# Patient Record
Sex: Female | Born: 1965 | Race: Black or African American | Hispanic: No | Marital: Married | Smoking: Former smoker
Health system: Southern US, Community
[De-identification: ages and names within clinical notes are randomized; demographics above are authoritative.]

## PROBLEM LIST (undated history)

## (undated) HISTORY — PX: CHOLECYSTECTOMY: SHX55

## (undated) HISTORY — PX: ABDOMINAL HYSTERECTOMY: SHX81

---

## 1998-09-28 ENCOUNTER — Other Ambulatory Visit: Admission: RE | Admit: 1998-09-28 | Discharge: 1998-09-28 | Payer: Self-pay | Admitting: *Deleted

## 2000-04-05 ENCOUNTER — Other Ambulatory Visit: Admission: RE | Admit: 2000-04-05 | Discharge: 2000-04-05 | Payer: Self-pay | Admitting: Obstetrics and Gynecology

## 2000-09-28 ENCOUNTER — Emergency Department (HOSPITAL_COMMUNITY): Admission: EM | Admit: 2000-09-28 | Discharge: 2000-09-28 | Payer: Self-pay | Admitting: Emergency Medicine

## 2000-09-28 ENCOUNTER — Encounter: Payer: Self-pay | Admitting: Emergency Medicine

## 2000-09-30 ENCOUNTER — Inpatient Hospital Stay (HOSPITAL_COMMUNITY): Admission: AD | Admit: 2000-09-30 | Discharge: 2000-09-30 | Payer: Self-pay | Admitting: Obstetrics & Gynecology

## 2001-04-15 ENCOUNTER — Other Ambulatory Visit: Admission: RE | Admit: 2001-04-15 | Discharge: 2001-04-15 | Payer: Self-pay | Admitting: Obstetrics and Gynecology

## 2002-08-03 ENCOUNTER — Other Ambulatory Visit: Admission: RE | Admit: 2002-08-03 | Discharge: 2002-08-03 | Payer: Self-pay | Admitting: Obstetrics and Gynecology

## 2003-02-16 ENCOUNTER — Encounter: Payer: Self-pay | Admitting: Obstetrics and Gynecology

## 2003-02-16 ENCOUNTER — Inpatient Hospital Stay (HOSPITAL_COMMUNITY): Admission: AD | Admit: 2003-02-16 | Discharge: 2003-02-19 | Payer: Self-pay | Admitting: Obstetrics and Gynecology

## 2003-03-22 ENCOUNTER — Other Ambulatory Visit: Admission: RE | Admit: 2003-03-22 | Discharge: 2003-03-22 | Payer: Self-pay | Admitting: Obstetrics and Gynecology

## 2004-05-24 ENCOUNTER — Inpatient Hospital Stay (HOSPITAL_COMMUNITY): Admission: AD | Admit: 2004-05-24 | Discharge: 2004-05-27 | Payer: Self-pay | Admitting: Internal Medicine

## 2004-05-24 ENCOUNTER — Encounter: Admission: RE | Admit: 2004-05-24 | Discharge: 2004-05-24 | Payer: Self-pay | Admitting: Internal Medicine

## 2004-05-26 ENCOUNTER — Encounter (INDEPENDENT_AMBULATORY_CARE_PROVIDER_SITE_OTHER): Payer: Self-pay | Admitting: *Deleted

## 2005-06-04 IMAGING — CR DG CHEST 2V
2 series · 2 of 2 positions shown · non-contrast
Comparison: none

CLINICAL DATA: Gallstones, pancreatitis, pre-operative respiratory exam.
 TWO VIEW CHEST ? 05/24/04
 The heart size and mediastinal contours are normal. The lungs are clear. The visualized skeleton is unremarkable.

 IMPRESSION
 No active disease.

[view not recorded (1 of 2)]
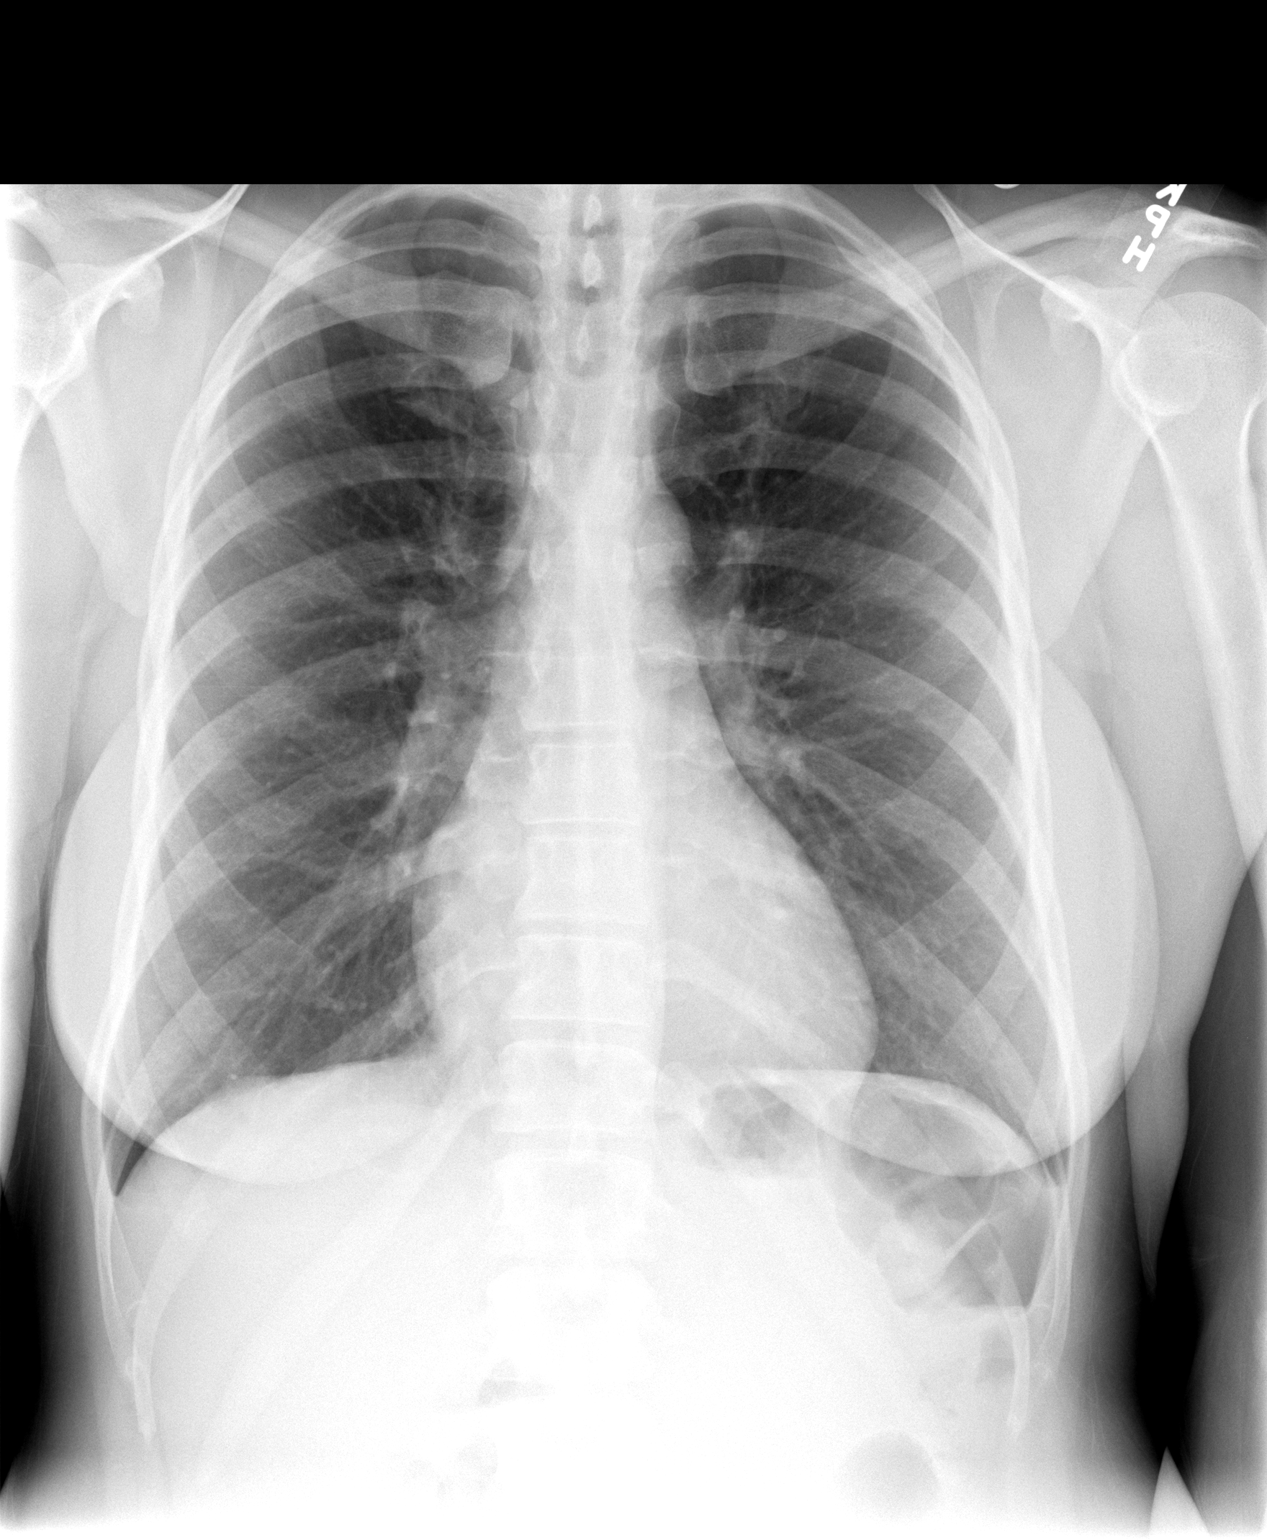

[view not recorded (2 of 2)]
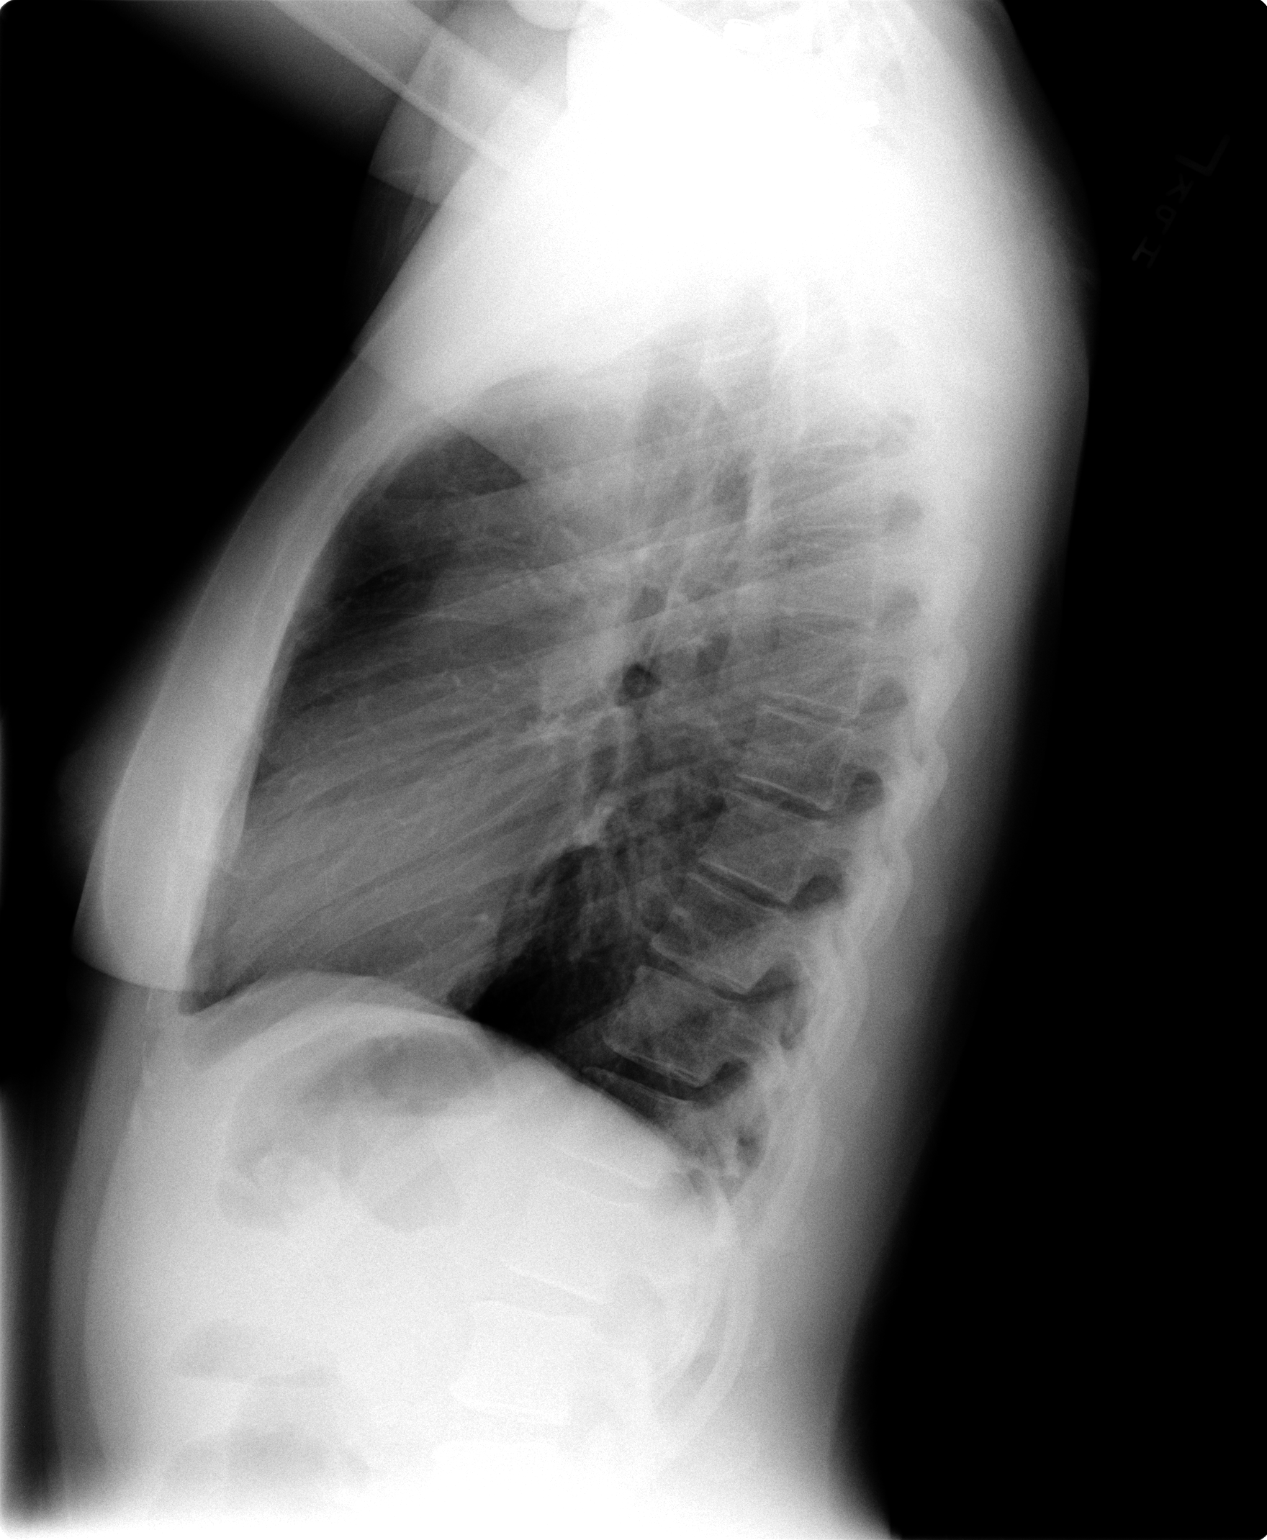

[2 of 2 positions shown; findings below may reference images not displayed]

## 2005-11-19 ENCOUNTER — Inpatient Hospital Stay (HOSPITAL_COMMUNITY): Admission: AD | Admit: 2005-11-19 | Discharge: 2005-11-28 | Payer: Self-pay | Admitting: Obstetrics and Gynecology

## 2005-12-05 ENCOUNTER — Inpatient Hospital Stay (HOSPITAL_COMMUNITY): Admission: AD | Admit: 2005-12-05 | Discharge: 2005-12-05 | Payer: Self-pay | Admitting: Obstetrics and Gynecology

## 2005-12-11 ENCOUNTER — Inpatient Hospital Stay (HOSPITAL_COMMUNITY): Admission: RE | Admit: 2005-12-11 | Discharge: 2005-12-14 | Payer: Self-pay | Admitting: Obstetrics and Gynecology

## 2005-12-11 ENCOUNTER — Encounter (INDEPENDENT_AMBULATORY_CARE_PROVIDER_SITE_OTHER): Payer: Self-pay | Admitting: *Deleted

## 2006-11-30 IMAGING — US US OB LIMITED
1 series · 13 of 28 positions shown · non-contrast
Comparison: none

CLINICAL DATA: 35 weeks estimated gestational age with fetal tachycardia.  Assess amniotic fluid volume and fetal well-being.

[Series 1: us ob limited · 0.41mm/px · 47 acquisitions, 13 frames shown]
[im 2/47]
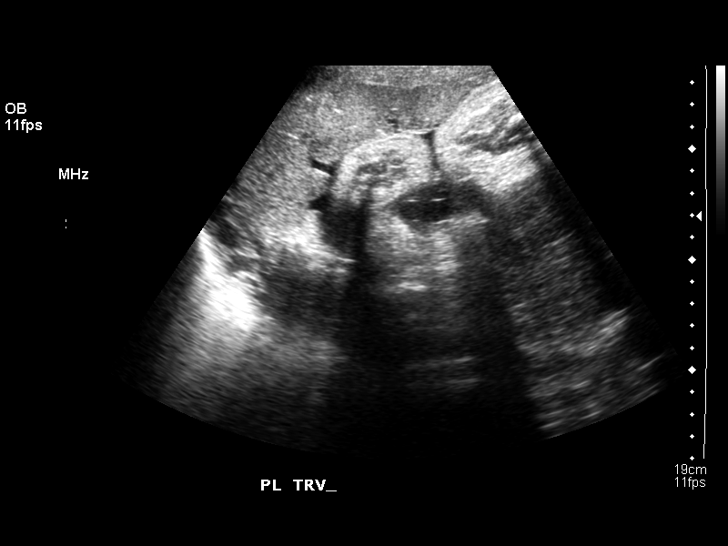
[im 6/47]
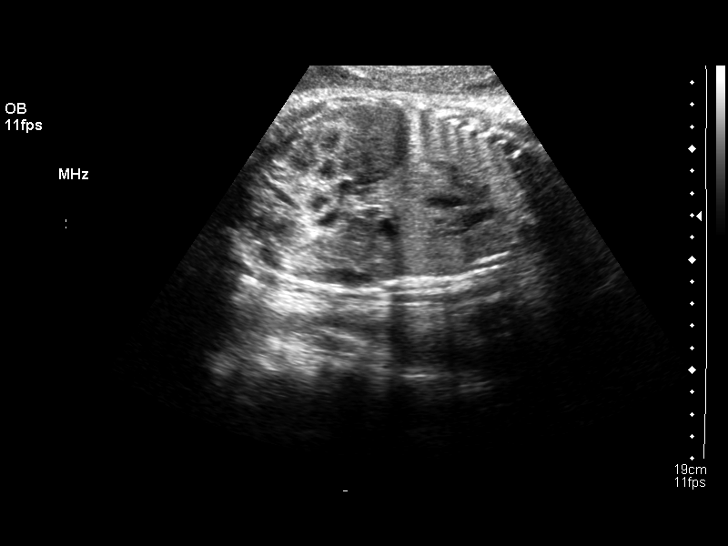
[im 9/47]
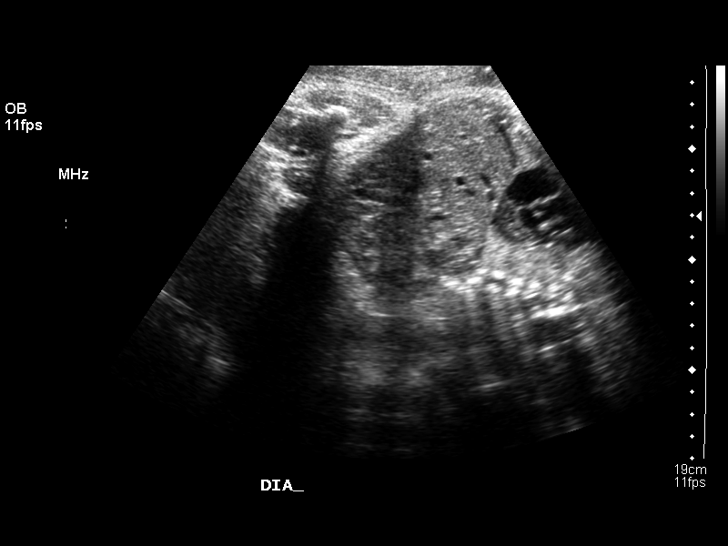
[im 12/47]
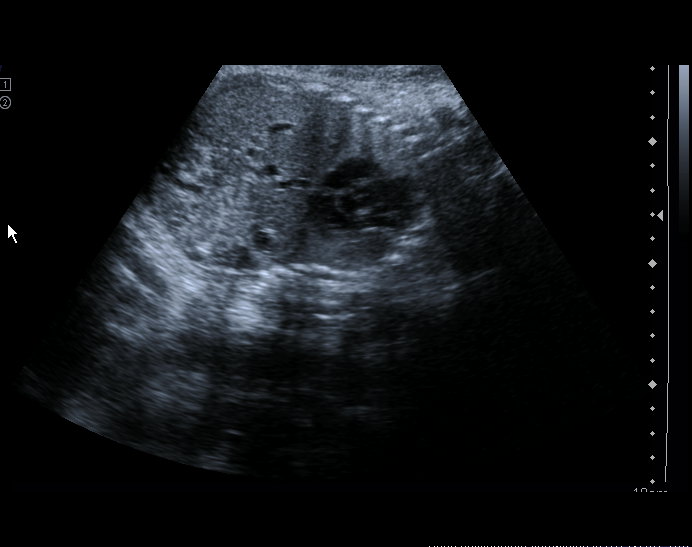
[im 16/47]
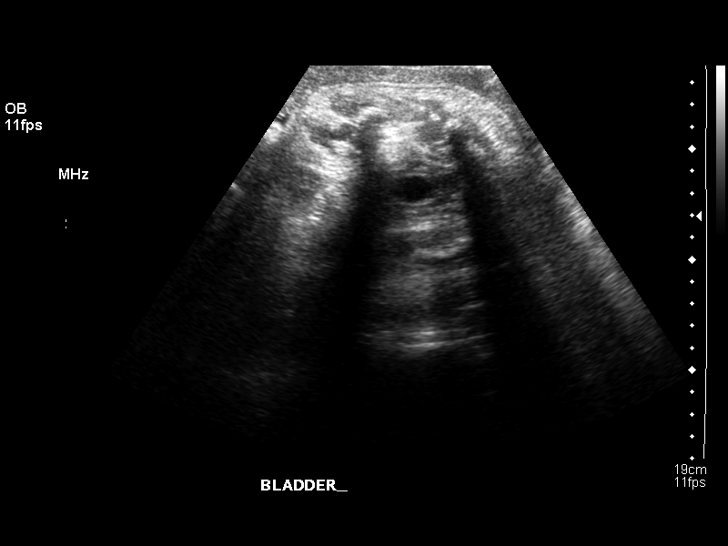
[im 19/47]
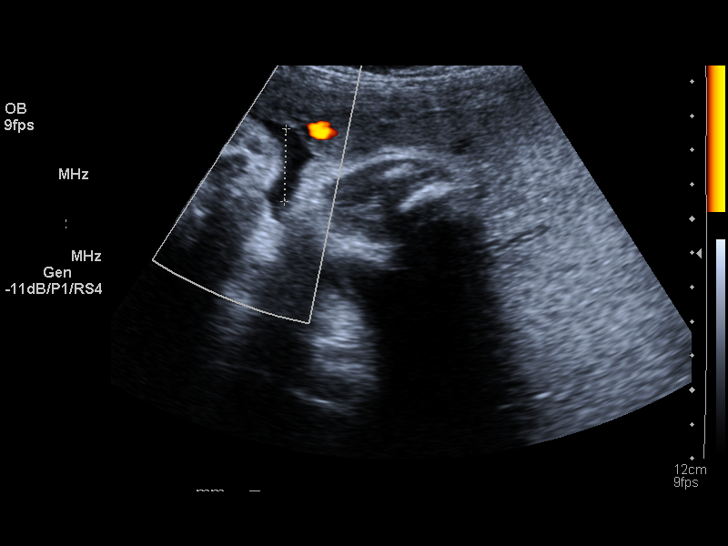
[im 24/47]
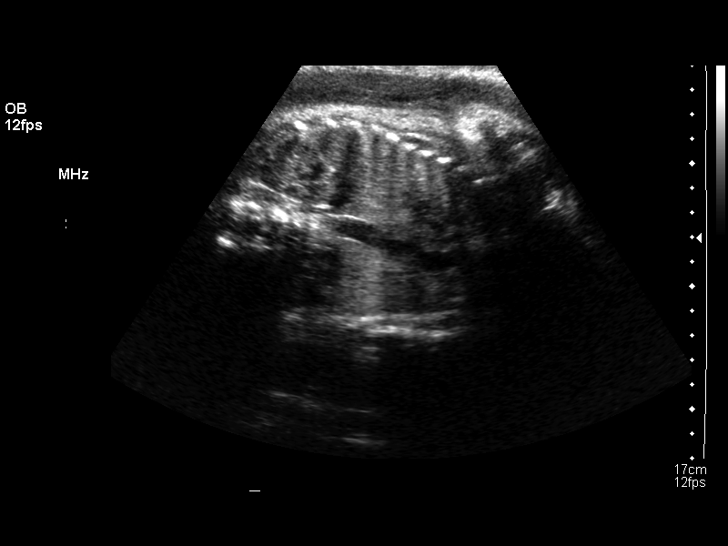
[im 28/47]
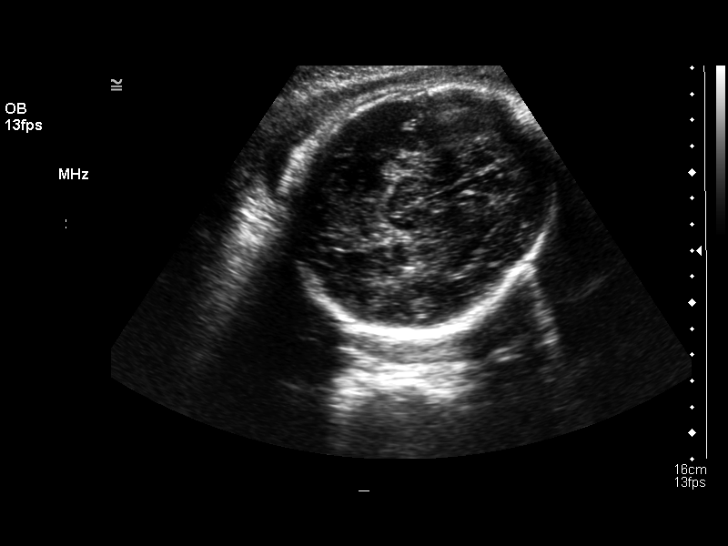
[im 31/47]
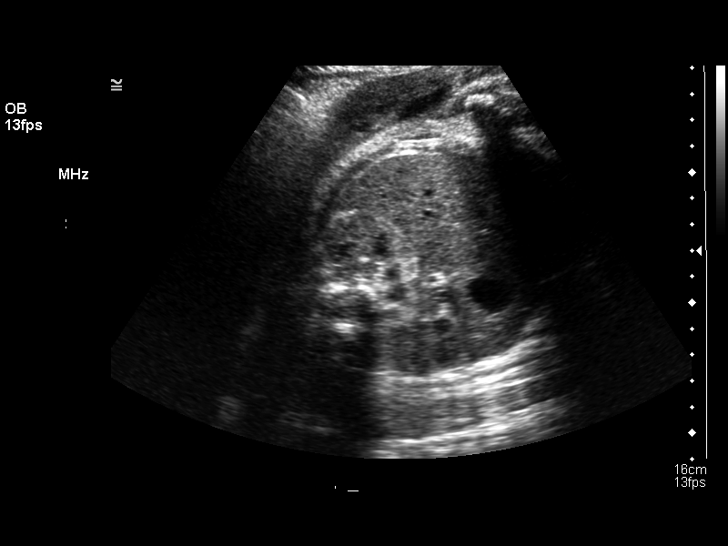
[im 35/47]
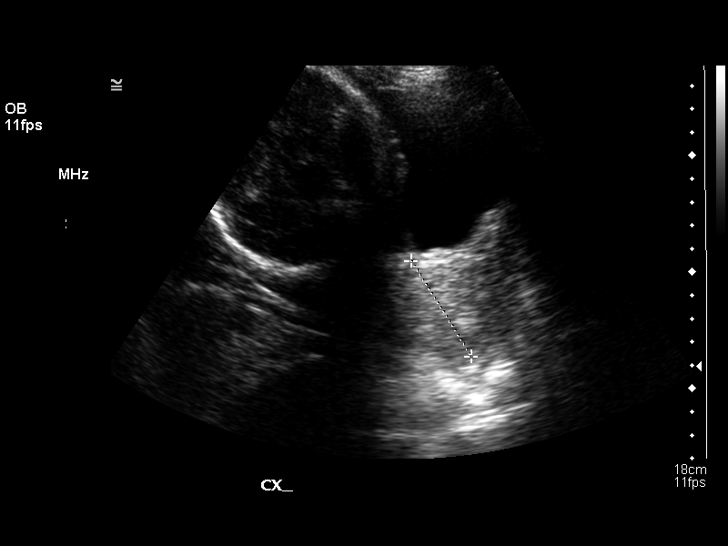
[im 38/47]
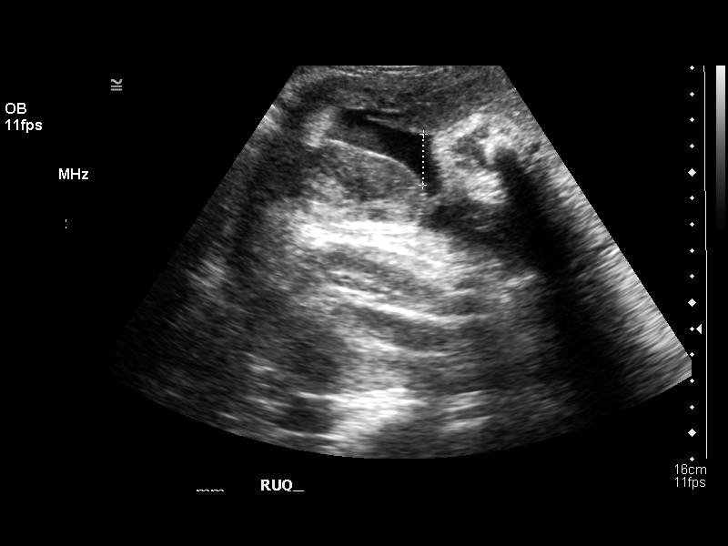
[im 41/47]
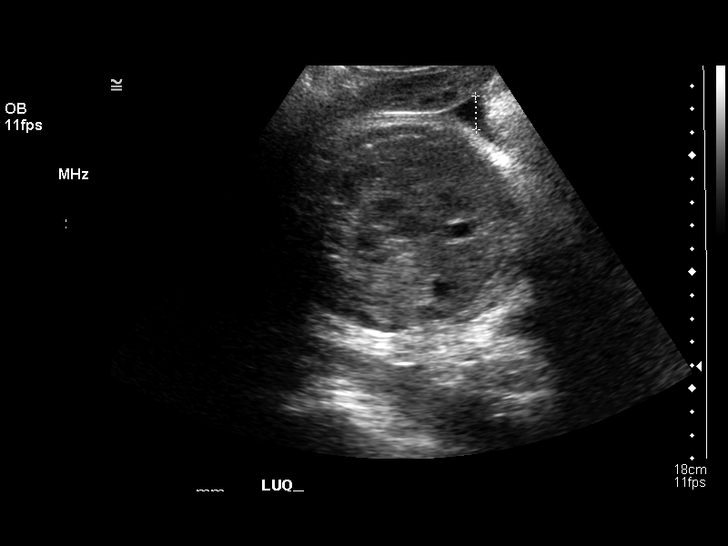
[im 45/47]
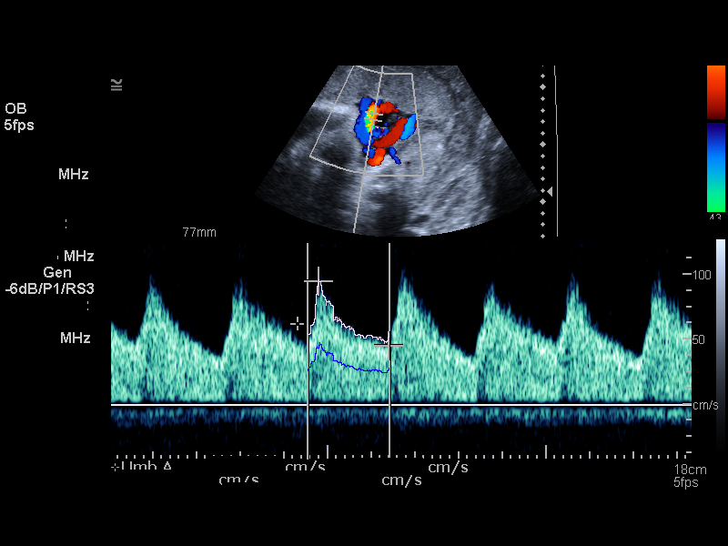

[13 of 28 positions shown; findings below may reference images not displayed]

LIMITED OBSTETRICAL ULTRASOUND:
Number of Fetuses:  1
Heart Rate:  163
Movement:  Yes
Breathing:  Yes
Presentation:  Cephalic
Placental Location:  Anterior, right lateral 
Grade:  II
Previa:  No
Amniotic Fluid (Subjective):  Decreased
Amniotic Fluid (Objective):  7.3 cm AFI (5th -95th%ile = 7.7 ? 24.9 cm for 36 wks)

Fetal measurements and complete anatomic evaluation were not requested.  The following fetal anatomy was visualized during this exam:  Thalami, stomach, kidneys, bladder, and diaphragm.

MATERNAL UTERINE AND ADNEXAL FINDINGS
Cervix:  4.3 cm Transabdominally
BIOPHYSICAL PROFILE

Movement:  2      Time:  20 minutes
Breathing:  2
Tone:  2
Amniotic Fluid:  2

Total Score:  8

DOPPLER ULTRASOUND OF FETUS:

Umbilical Artery S/D Ratio: 2.41    (NL< 3.33)
IMPRESSION: 1.  Subjectively and quantitatively slightly decreased amniotic fluid volume with an amniotic fluid index measured at 7.3 cm (7.7 to 24.9 cm for a 36 week gestation).  Because of the slightly decreased amniotic fluid volume, umbilical artery Doppler evaluation was performed.
2.  Biophysical profile score [DATE].
3.  Normal cervical length.  
4.  No late developing fetal anatomic abnormalities are identified associated with the lateral ventricles, stomach, kidneys, or bladder.  A four chamber heart view could not be reassessed due to positioning on today?s exam.
5.  Normal umbilical artery systolic-to-diastolic ratio.

## 2006-12-01 IMAGING — US US FETAL BPP W/O NONSTRESS
1 series · 14 of 25 positions shown · non-contrast
Comparison: Film done earlier the same day.

CLINICAL DATA: 39-year-old female with 36 week gestation with variable fetal heart rate and tachycardia. 

 BIOPHYSICAL PROFILE

[Series 1: us fetal bpp w/o nonstress · 0.33mm/px · 25 acquisitions, 14 frames shown]
[im 1/25]
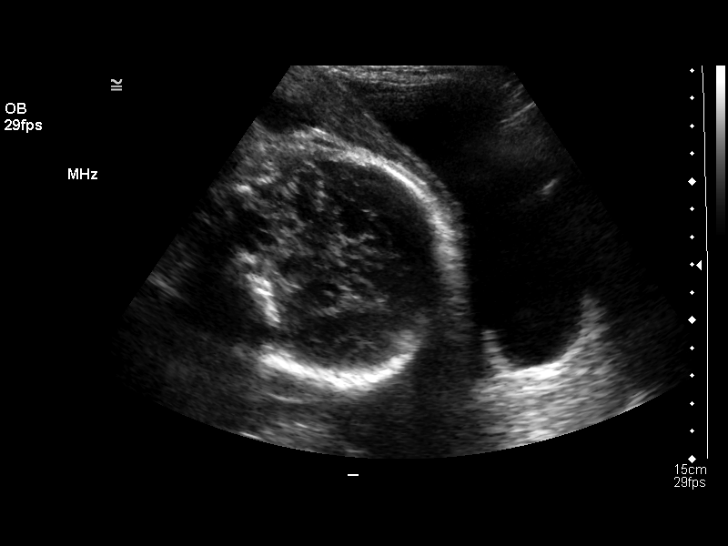
[im 3/25]
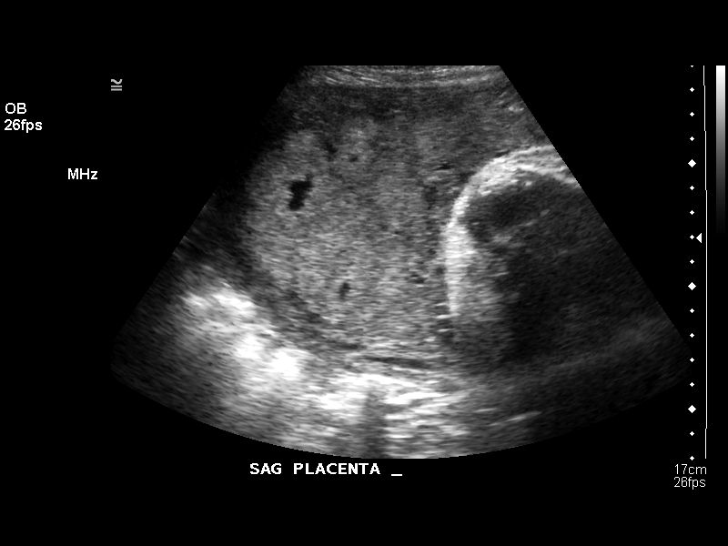
[im 5/25]
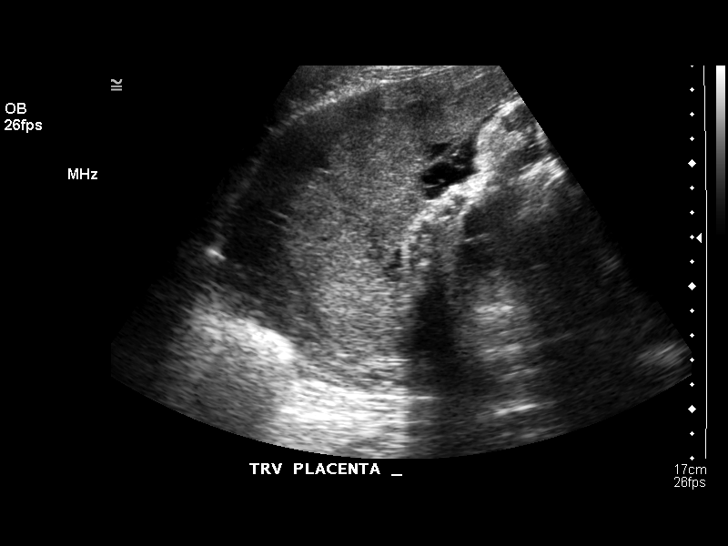
[im 7/25]
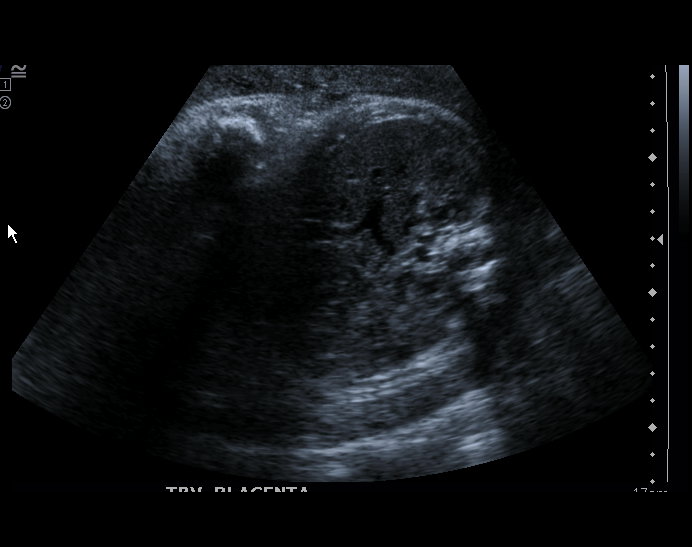
[im 9/25]
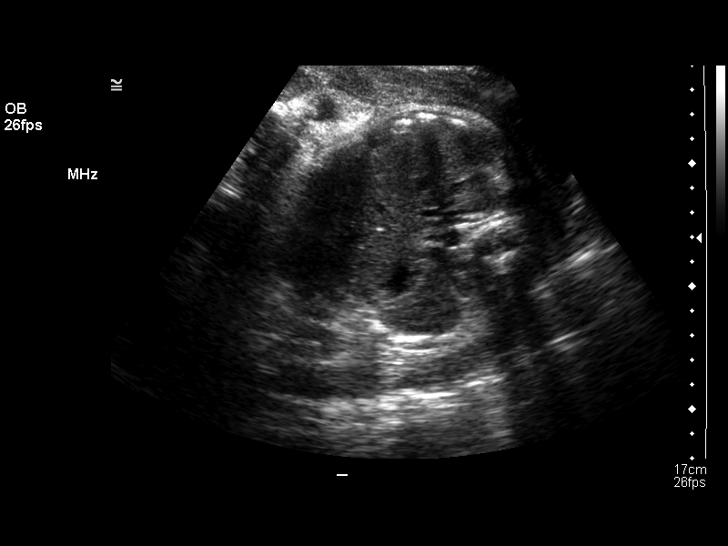
[im 10/25]
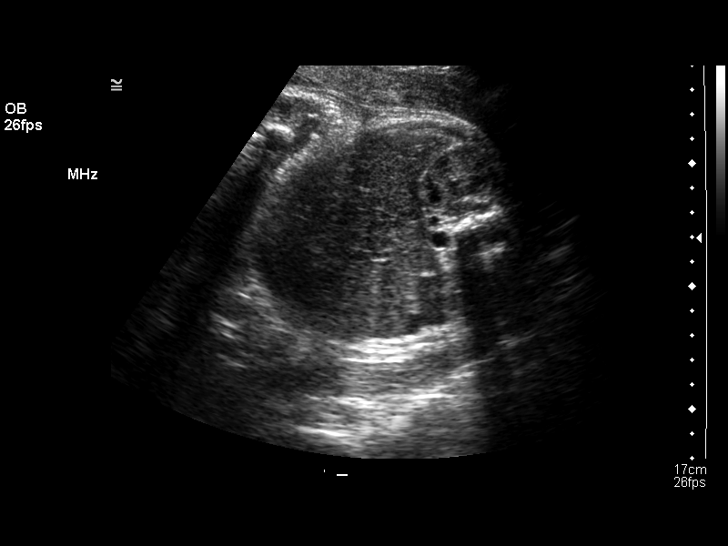
[im 12/25]
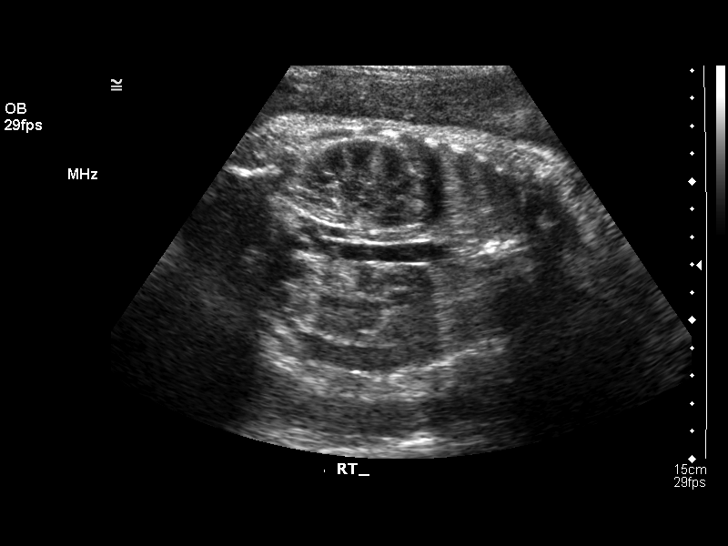
[im 14/25]
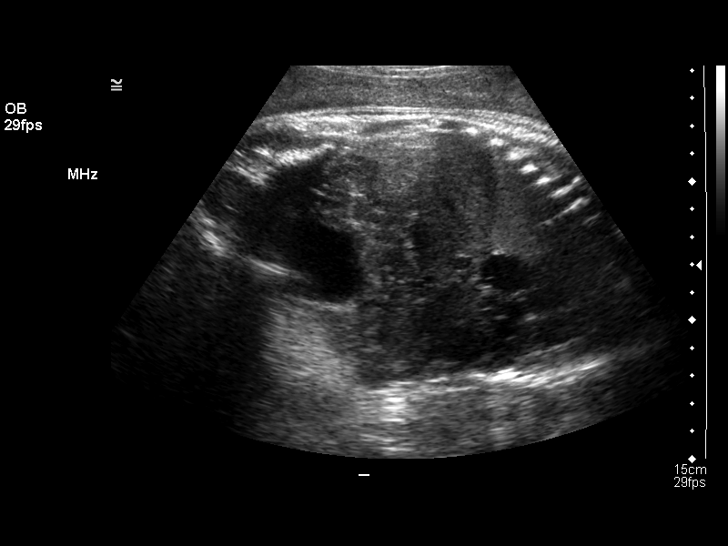
[im 16/25]
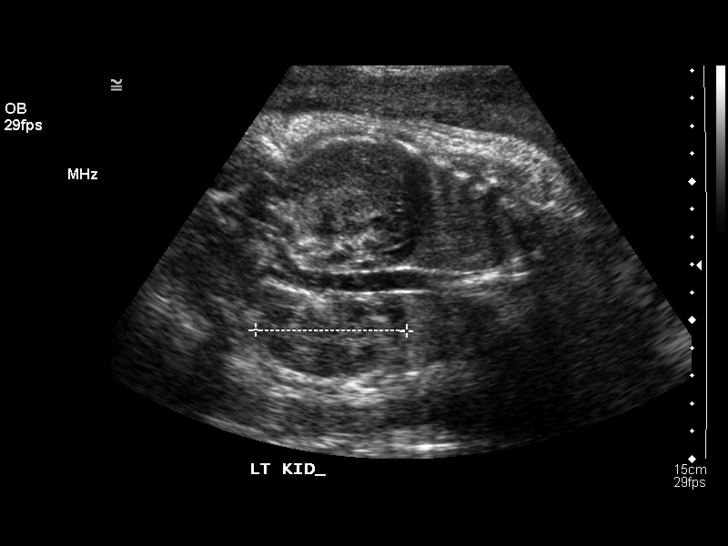
[im 17/25]
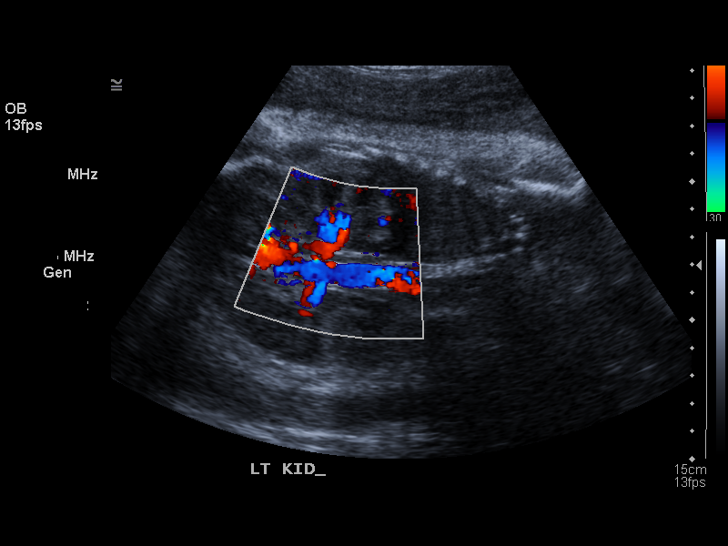
[im 19/25]
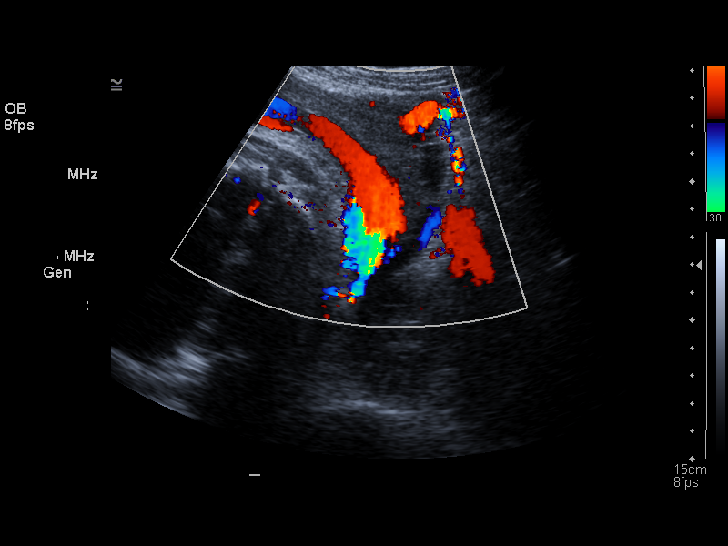
[im 21/25]
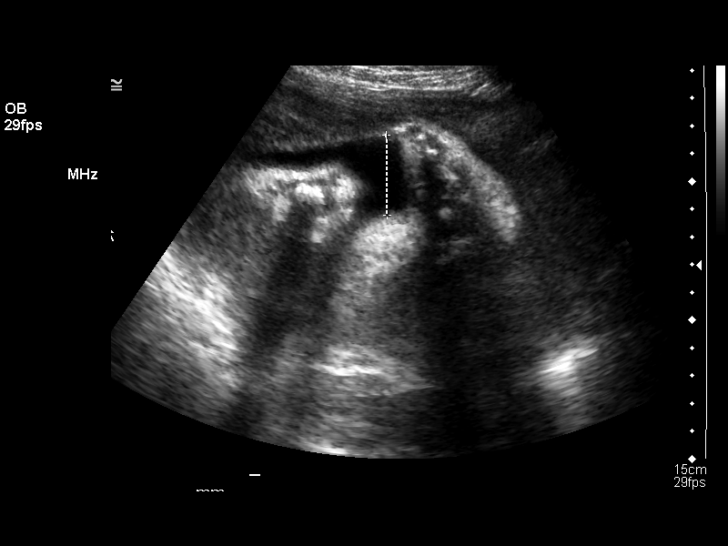
[im 23/25]
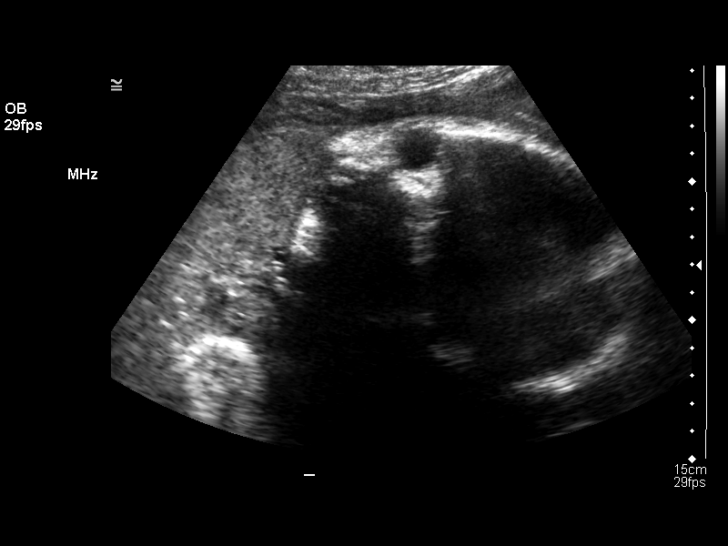
[im 25/25]
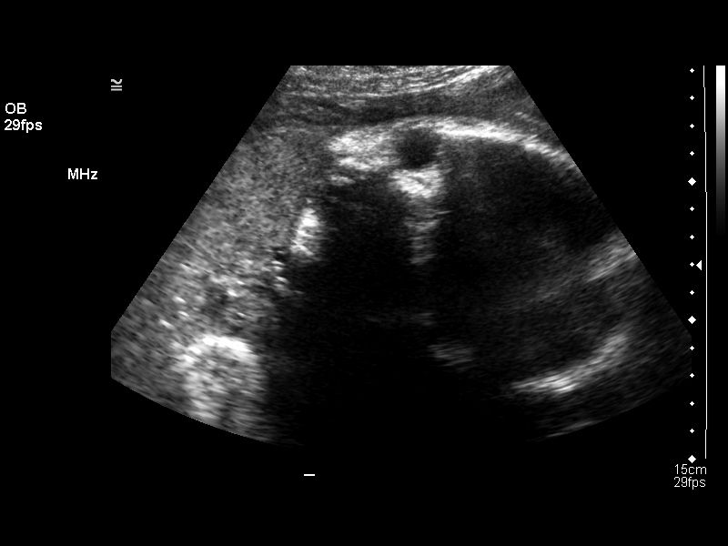

[14 of 25 positions shown; findings below may reference images not displayed]

Number of Fetuses:  1
 Heart rate:  163
 Movement:  Yes
 Breathing:  Yes
 Presentation:  Cephalic
 Placental Location:  Anterior, lateral 
 Grade:  II
 Previa:  No
 Amniotic Fluid (Subjective):  Decreased
 Amniotic Fluid (Objective):  5.7 cm AFI (5th -95th%ile = 7.7 ? 24.9 cm for 36 wks)

 Fetal measurements and complete anatomic evaluation were not requested.  The following fetal anatomy was visualized on this exam: Stomach, kidneys and bladder.

 BPP SCORING
 Movements:  2  Time:  10 minutes
 Breathing:  2
 Tone:  2
 Amniotic Fluid:  2
 Total Score:   8

 MATERNAL UTERINE AND ADNEXAL FINDINGS
 Cervix:  Not evaluated.
IMPRESSION: 1.  Single living intrauterine gestation in cephalic presentation with assigned gestational age of 36 weeks 0 days.  
 2.  Biophysical profile score [DATE] at 10 minutes.  
 3.  Oligohydramnios.

## 2006-12-03 IMAGING — US US OB LIMITED
1 series · 13 of 22 positions shown · non-contrast
Comparison: none

CLINICAL DATA: 35 weeks estimated gestational age with fetal tachycardia.  Assess amniotic fluid volume.

[Series 1: us ob limited · 0.39mm/px · 13 of 22 slices shown]
[im 1/22]
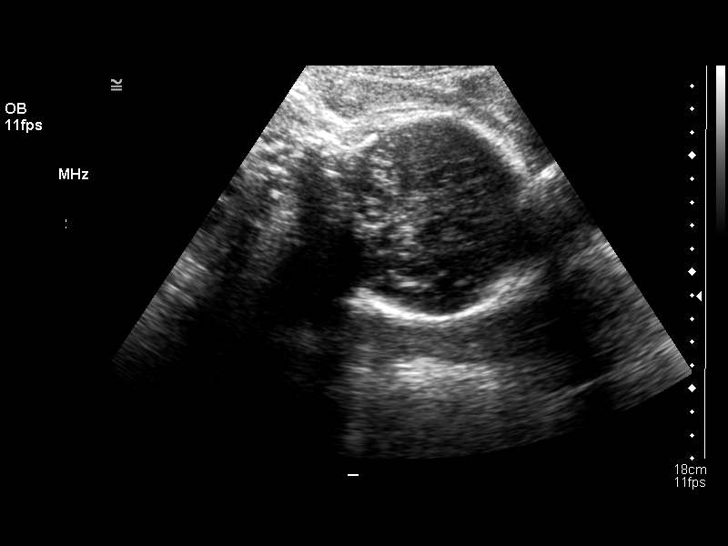
[im 3/22]
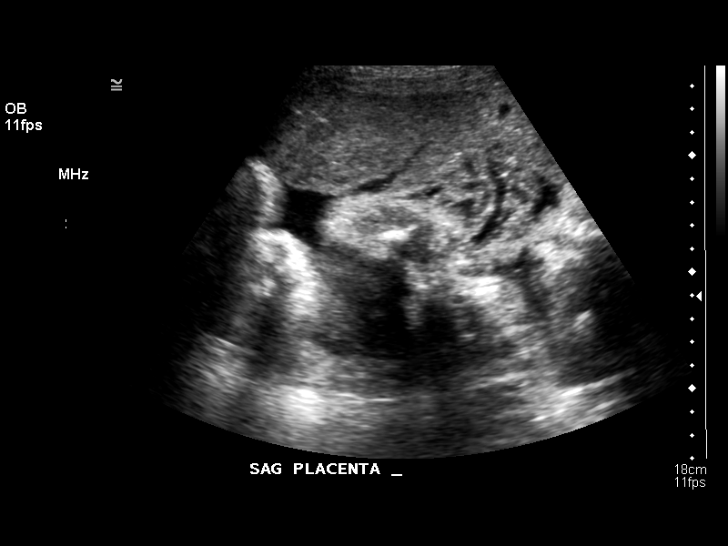
[im 5/22]
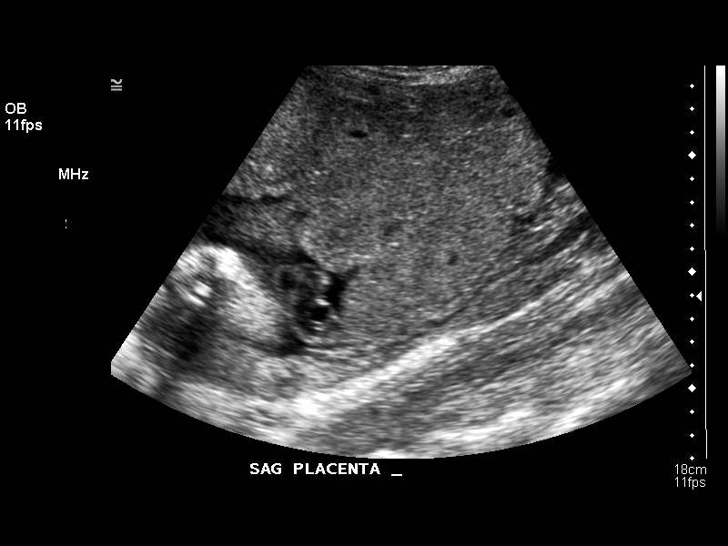
[im 6/22]
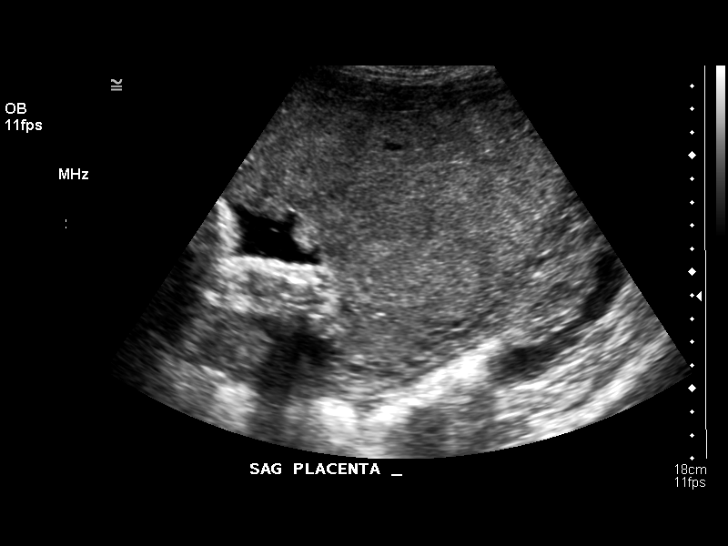
[im 8/22]
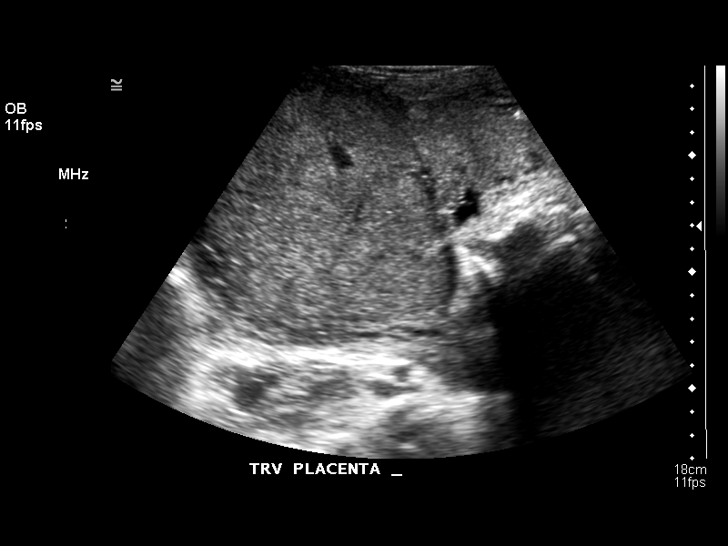
[im 10/22]
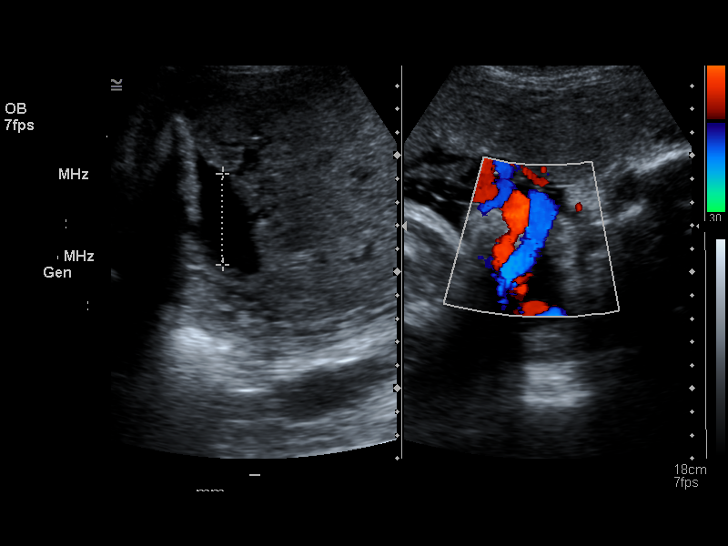
[im 12/22]
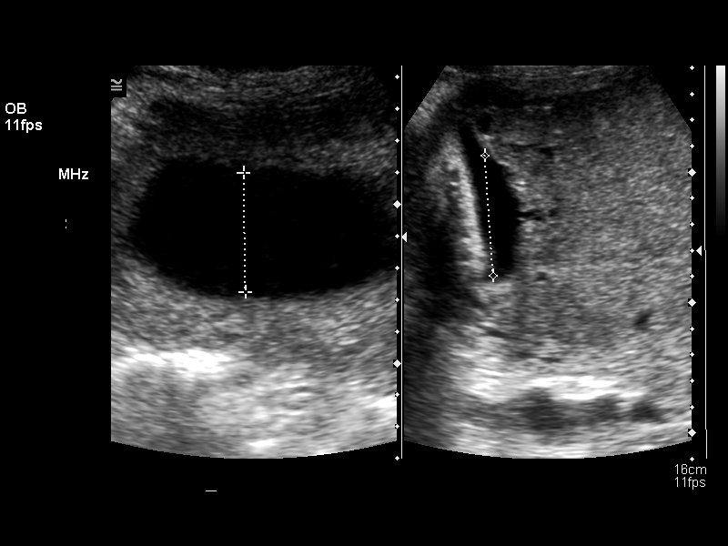
[im 13/22]
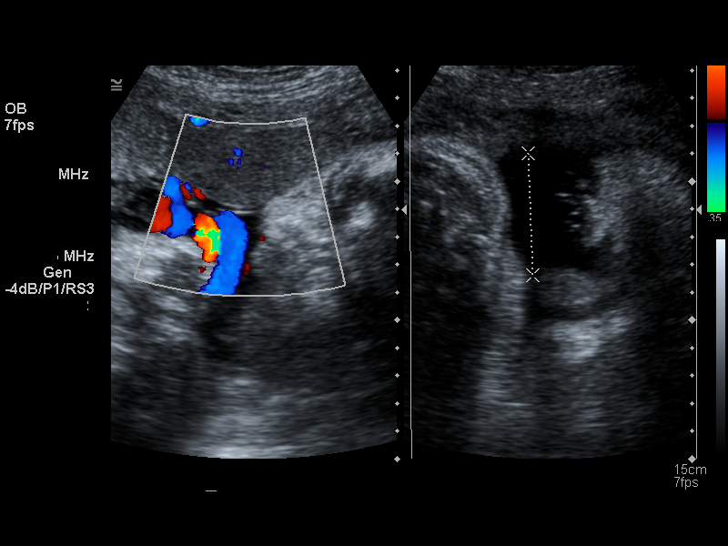
[im 15/22]
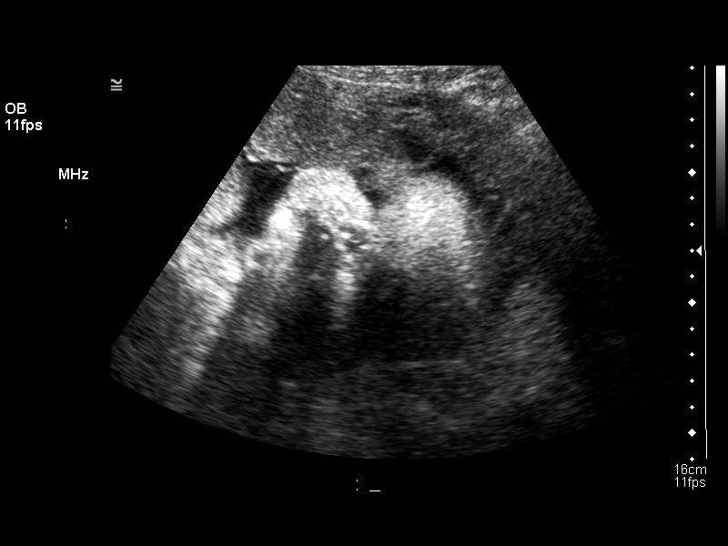
[im 17/22]
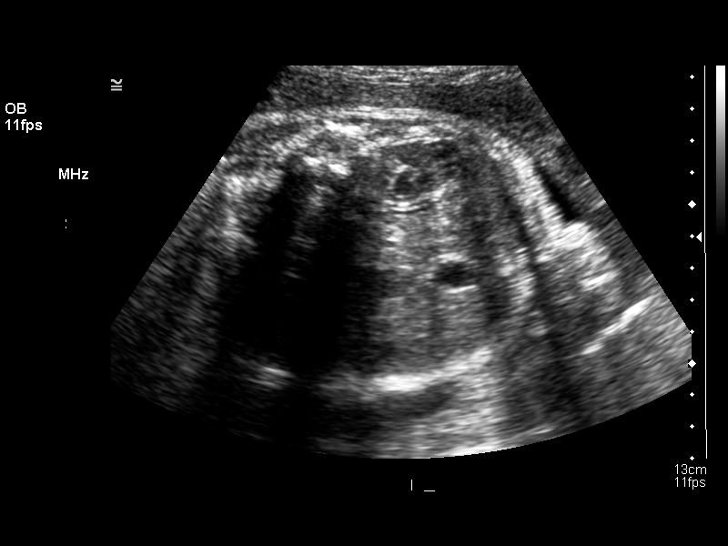
[im 18/22]
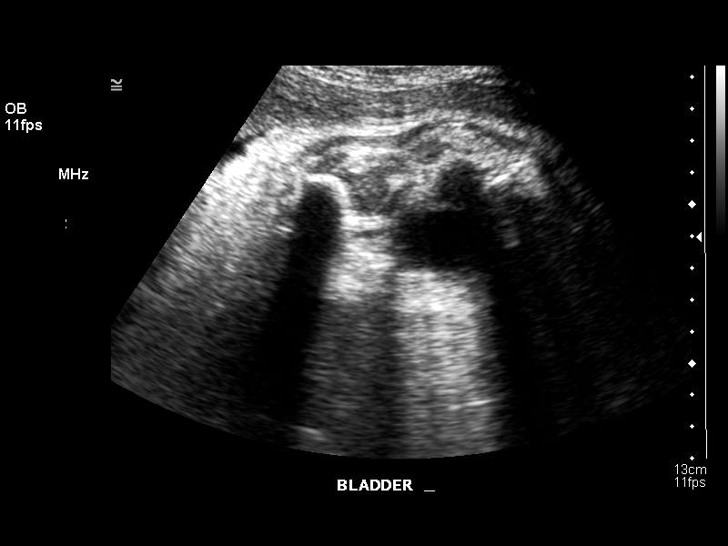
[im 20/22]
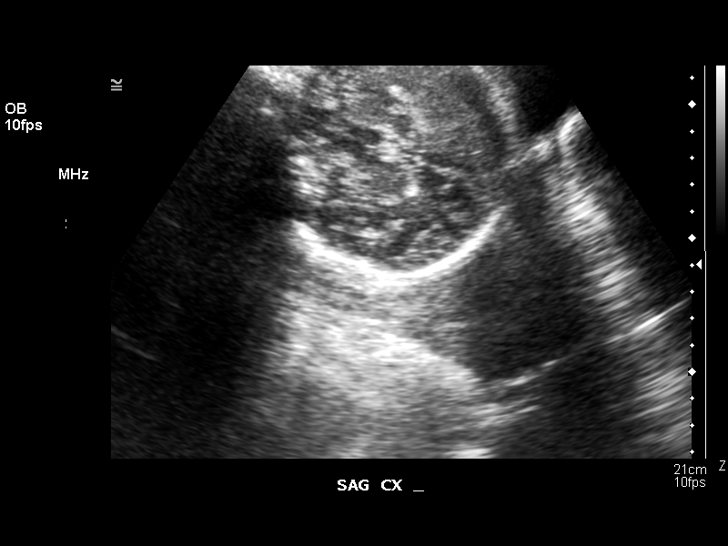
[im 22/22]
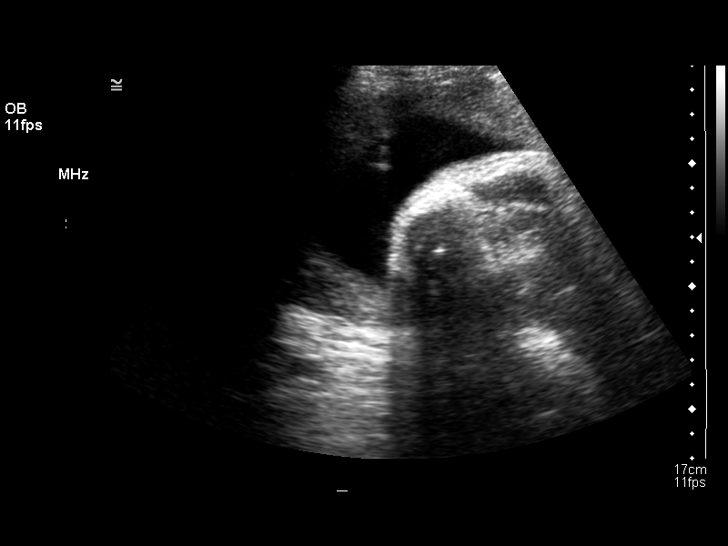

[13 of 22 positions shown; findings below may reference images not displayed]

LIMITED OBSTETRICAL ULTRASOUND:
Number of Fetuses:  1
Heart Rate:  122
Movement:  Yes
Breathing:  No
Presentation:  Cephalic
Placental Location:  Anterior, right lateral 
Grade: II
Previa:  No
Amniotic Fluid (Subjective):  Normal
Amniotic Fluid (Objective):  11.4 cm AFI (5th -95th%ile = 7.7 ? 24.9 cm for 36 wks)

Fetal measurements and complete anatomic evaluation were not requested.  The following fetal anatomy was visualized during this exam:  Lateral ventricles, stomach, kidneys, and bladder.

MATERNAL UTERINE AND ADNEXAL FINDINGS
Cervix:  4.4 cm Transabdominally
IMPRESSION: 1.  Subjectively and quantitatively normal amniotic fluid volume with an amniotic fluid index today measured at 11.4 cm.  This represents an interval improvement since the previous exam performed on 11/20/05 at which time the amniotic fluid volume was felt subjectively to be decreased with an amniotic fluid index measured at 5.7 cm.  
2.  Normal cervical length.
3.  No late developing fetal anatomic abnormalities are seen associated with the lateral ventricles, stomach, kidneys or bladder.  A four chamber heart view could not be reassessed due to positioning on today?s exam.

## 2011-04-09 ENCOUNTER — Other Ambulatory Visit: Payer: Self-pay

## 2011-04-09 ENCOUNTER — Other Ambulatory Visit (HOSPITAL_COMMUNITY)
Admission: RE | Admit: 2011-04-09 | Discharge: 2011-04-09 | Disposition: A | Discharge status: Home / Self Care | Payer: BC Managed Care – PPO | Source: Ambulatory Visit | Attending: Internal Medicine | Admitting: Internal Medicine

## 2011-04-09 DIAGNOSIS — Z01419 Encounter for gynecological examination (general) (routine) without abnormal findings: Secondary | ICD-10-CM | POA: Insufficient documentation

## 2011-05-04 NOTE — Discharge Summary (Signed)
   [name], [name]                         ACCOUNT NO.:  [id number]   MEDICAL RECORD NO.:  [id number]                   PATIENT TYPE:   LOCATION:                                       FACILITY:   PHYSICIAN:  Lenoard Aden, M.D.             DATE OF BIRTH:   DATE OF ADMISSION:  DATE OF DISCHARGE:                                 DISCHARGE SUMMARY   HISTORY OF PRESENT ILLNESS:  The patient underwent uncomplicated Cesarean  section on February 16, 2003.   DISPOSITION:  Discharged to home on February 19, 2003. Postoperative course  uncomplicated. Discharge teaching done. Iron, prenatal vitamins, Motrin and  Tylox given upon discharge. Discharge pamphlet given.   FOLLOW UP:  In the office in 4-6 weeks.                                               Lenoard Aden, M.D.    RJT/MEDQ  D:  04/03/2003  T:  04/03/2003  Job:  [id number]

## 2011-05-04 NOTE — Consult Note (Signed)
NAME:  [name], [name]                        ACCOUNT NO.:  [id number]   MEDICAL RECORD NO.:  [id number]                   PATIENT TYPE:  INP   LOCATION:  5735                                 FACILITY:  [facility]   PHYSICIAN:  Jimmye Norman III, M.D.               DATE OF BIRTH:  [birth year]   DATE OF CONSULTATION:  05/24/2004  DATE OF DISCHARGE:                                   CONSULTATION   Dear Dr. Delrae Alfred,   Thank you very much for asking me to see Ms. [name], a very pleasant  45 year old female with likely long history of symptoms related to  gallstones with recent acute gallstone pancreatitis.   HISTORY OF PRESENT ILLNESS:  She came in to see [place] today with abnormal liver  function tests yesterday and a worsening amylase and lipase today up in the  1000s.  Her pain has improved.  However, because of her ultrasound showing  multiple small gallstones, and the abnormal liver function tests with a  rising bilirubin, amylase, and lipase, she was admitted for gallstone  pancreatitis and consideration for ERCP, and laparoscopic cholecystectomy  with cholangiogram.   PAST MEDICAL HISTORY:  Very unremarkable.  She has no history of cardiac  disease, renal disease, pulmonary disease, or liver disease.   PAST SURGICAL HISTORY:  The only surgery is for a cesarean section 15 months  ago.   SOCIAL HISTORY:  She is a nonsmoker, has occasional wine, but does not drink  excessively.  She does continue to work.   REVIEW OF SYSTEMS:  She has chronic sort of indigestion, takes Tums on a  regular basis.  Is not taking Prevacid or Protonix or any other acid  suppressing medication.   ALLERGIES:  No known drug allergies.   PHYSICAL EXAMINATION:  VITAL SIGNS:  She is afebrile with a temperature of  98.1, her pulse is 81, blood pressure 116/79, her saturations are 97% on  room air.  HEENT:  Normocephalic, atraumatic, anicteric.  NECK:  Supple, no bruits, no palpable thyroid lesions.  LUNGS:  Clear to auscultation.  CARDIAC:  Regular rate and rhythm.  She does have a grade 1/6 murmur over  the cardiac silhouette and at the left lower sternal border.  There is no  thrill.  ABDOMEN:  Soft.  She is slightly distended with tenderness in the  epigastrium, mild tenderness in the right upper quadrant and right lower  quadrant.  No rebound or guarding.  Hypoactive bowel sounds.  PELVIC:  Not performed at this time.  RECTAL:  Not performed at this time.  BREASTS:  Not performed.  NEUROLOGIC:  Cranial nerves II-XII are grossly intact.   LABORATORY DATA:  She has a white count of 7, hemoglobin of 11.5, hematocrit  of 34.7, platelets of 236.  By report, her amylase and lipase were in the  1000s.  Amylase in the 4000 range.  Total bilirubin was above 1.5.  Ultrasound by report also demonstrates multiple small gallstones.   IMPRESSION:  Very clearly the patient has had an episode of significant  gallstone pancreatitis, and although the ultrasound does not definitively  show stones in the common bile duct, they could be there or migrate there  and repeat this episode of pancreatitis.  Currently, it seems to be a mild  form of pancreatitis, although her amylase and lipase are very elevated.   PLAN:  Go ahead and repeat her liver function tests along with her  pancreatic enzymes tomorrow morning, and if they show signs of improvement,  then go ahead and plan for a laparoscopic cholecystectomy with  intraoperative cholangiogram once the labs have normalized to a level of  safety.  This may be Friday morning or possibly Saturday morning, depending  upon how the patient is doing and how quickly she resolves her symptoms.  If  her liver function tests are worsening or about the same, I would elect to  do a preoperative endoscopic retrograde cholangiopancreatography with  possible sphincterotomy and stone removal.  Dr. Ewing Schlein has seen the patient  and we agree upon this plan based on his  written report.                                               Kathrin Ruddy, M.D.    JW/MEDQ  D:  05/24/2004  T:  05/25/2004  Job:  [id number]

## 2011-05-04 NOTE — Consult Note (Signed)
NAME:  [name], [name]                        ACCOUNT NO.:  [id number]   MEDICAL RECORD NO.:  [id number]                   PATIENT TYPE:  INP   LOCATION:  5735                                 FACILITY:  [facility]   PHYSICIAN:  Petra Kuba, M.D.                 DATE OF BIRTH:  Mar 12, 1966   DATE OF CONSULTATION:  05/24/2004  DATE OF DISCHARGE:                                   CONSULTATION   HISTORY OF PRESENT ILLNESS:  The patient is seen at the request of Dr.  Delrae Alfred for belly pain and almost certainly gallstone pancreatitis based on  labs and ultrasound which showed gallstones, but normal CBD and probably  pancreas.  Her symptoms have been getting worse since Wednesday when she ate  at the [employer].  She has really not had much GI symptoms previously.  She  does hurt in the midepigastric area, maybe some radiation to the back with  some nausea.  She has not had any fever.  She has been able to work and eat,  although, eating has made her worse.  In the past, she has been told she  might have some irritable bowel and some hemorrhoids and may have been put  on Tagamet 10 to 15 years ago, but no other obvious GI test was done.  She  did have [name] baby about 15 months ago, but was not aware that she had  gallstones.   PAST MEDICAL HISTORY:  Essentially negative.  She has not had any previous  surgeries.   ALLERGIES:  No known drug allergies.   CURRENT MEDICATIONS:  Protonix started two days ago and prenatal vitamins.   SOCIAL HISTORY:  Rarely drinks, does not smoke.  Minimizes over-the-counter  medicines.   FAMILY HISTORY:  Pertinent for grandmother and great aunt with gallstones,  but no other GI problems in the family.   REVIEW OF SYSTEMS:  Negative except as above.   PHYSICAL EXAMINATION:  VITAL SIGNS:  See chart, afebrile.  GENERAL:  The patient looks good, in no acute distress.  Examination pertinent for abdomen being very minimally tender in the upper  abdomen with no  guarding or rebound.  Soft.   LABORATORY DATA:  Pertinent for transaminases that were 1000 and that have  decreased.  But her bilirubin has gone from normal to 3 and likewise her  amylase and lipase were minimally elevated yesterday, but today have jumped  up to about 4000.   ASSESSMENT:  Gallstone pancreatitis.   PLAN:  Since the patient looks so good and is almost asymptomatic currently,  would wait on the [name].m. labs to decide ERCP first versus laparoscopic  cholecystectomy and intraoperative cholangiogram.  My guess is if she  continues to improve, her labs will decrease and she probably did pass the  stone.  I have discussed the risks, benefits, and methods of both ERCP,  sphincterotomy, stone extraction, possibly stent  placement versus  laparoscopic  cholecystectomy and intraoperative cholangiogram and compared those to open  cholangiogram with both the patient and her husband.  I will be on standby  to help p.r.n. otherwise recheck in the [name].m., but call me sooner p.r.n.  I  have discussed above with Dr. Delrae Alfred.                                               Petra Kuba, M.D.    MEM/MEDQ  D:  05/24/2004  T:  05/25/2004  Job:  [id number]   cc:   Marcene Duos, M.D.  [facility]  [place]  [place] [address]  Fax: [phone]   Jimmye Norman III, M.D.  [phone] [address]  [place]  [place] [address]  Fax: [phone]

## 2011-05-04 NOTE — Op Note (Signed)
[name], [name]              ACCOUNT NO.:  [id number]   MEDICAL RECORD NO.:  [id number]          PATIENT TYPE:  INP   LOCATION:  9105                          FACILITY:  [facility]   PHYSICIAN:  Maxie Better, M.D.DATE OF BIRTH:  [birth year]   DATE OF PROCEDURE:  12/11/2005  DATE OF DISCHARGE:                                 OPERATIVE REPORT   PREOPERATIVE DIAGNOSES:  1.  Previous cesarean section.  2.  Desires sterilization.  3.  Term gestation.  4.  Umbilical hernia.   PROCEDURE:  1.  Repeat cesarean section through a low transverse uterine incision.  2.  Left salpingectomy and right partial salpingectomy.  3.  Lysis of adhesions.  4.  Umbilical hernia repair by Dr. Lindie Spruce.   >/ SURGEON:  Maxie Better, M.D.   ASSISTANT:  Gerri Spore B. Earlene Plater, M.D.   ANESTHESIA:  Spinal.   PROCEDURE:  Under adequate spinal anesthesia, this patient was placed in the  supine position with a left lateral tilt.  She was sterilely prepped and  draped in the usual fashion to encompass above the umbilical area.  An  indwelling Foley catheter was already placed.  Eight mL of 0.25% Marcaine  were injected along the previous Pfannenstiel skin incision.  The  Pfannenstiel skin incision was then made and carried down to the rectus  fascia.  The rectus fascia was incised in the midline and extended  bilaterally.  The rectus fascia was then bluntly sharply dissected off the  rectus muscle in superior inferior fashion.  The rectus muscle was split in  midline.  The parietal peritoneum was entered and extended superiorly  inferiorly.  The bladder adhesions were then noted.  Careful dissection was  done and the vesicouterine peritoneum was then opened.  The bladder  partially was able to be dissected off of the lower uterine segment.  A  curvilinear low transverse uterine incision was then made and extended  bilaterally using bandage scissors.  Artificial rupture of membranes was  noted.  Subsequent  delivery of a live female with cord around the neck.  The  baby was bulb suctioned on the abdomen.  The cord was clamped and cut.  The  baby was transferred to the waiting pediatricians, who assigned Apgar's of 8  and 9 at 1 and 5 minutes.  Weight of the baby was 7 pounds 6 ounces.  The  cord bloods were obtained.  Cord blood for banking was also obtained.  The  placenta was manually removed.  The uterine cavity was then cleaned of  debris.  The uterine incision had no extension and was closed in one layer.  The closure was with 0 Monocryl running locked stitch with good hemostasis  noted.  Attention was then turned to the fallopian tubes.  The right  fallopian tube was identified down to its fimbriated end.  The right ovary  had some peritubal adhesions but otherwise normal.  The midportion of the  fallopian tube on the right was grasped with a Babcock.  The underlying  mesosalpinx was opened with cautery.  The proximal and distal portion of the  fallopian tube on the right was then tied with 0 chromic suture x2  proximally and distally and the intervening segment of tube was then  removed.  Attention was turned to the left, where the fimbriated end could  be identified close to the fundal area.  It was encased in adhesions, and in  trying to manipulate the tube and opening the adhesions with some lysis  being performed, increased bleeding was noted.  The tube was freed; however,  there was peritubal scarring and large blood vessels with ongoing bleeding.  The decision was then made to remove that tube.  The mesosalpinx was then  sequentially clamped and cut and the left tube was then removed.  Suture of  3-0 Vicryl was then used for hemostasis along the tube.  However, there was  bleeding noted on the posterior aspect of the uterus on that lateral side.  Several figure-of-8 sutures were placed.  Continued bleeding was noted.  The  decision was then made to exteriorize the uterus, which due  to its fibroid  nature was difficult to remove and, therefore, the skin incision was  extended bilaterally.  The uterus was then exteriorized.  The left ovary was  then noted to be posterior in the lower uterine segment and encased in  adhesions.  The bleeding along that lateral aspect was then hemostased with  multiple figure-of-8 sutures of 3-0 Vicryl.  When hemostasis was then  achieved, Dr. Lindie Spruce was brought in for the umbilical hernia repair.  On  inspection and palpation, the decision was made to do the hernia repair  externally and, therefore, the uterus was then returned to the abdomen.  The  abdomen was then copiously irrigated.  The distal and proximal end of the  tube on the right was inspected.  The tube was intact.  The left side was  intact.  The uterine incision had good hemostasis.  Small bleeders were  cauterized.  The parietal peritoneum and the vesicouterine peritoneum were  not closed.  The rectus fascia was closed with 0 Vicryl x2.  The  subcutaneous area was irrigated, small bleeders cauterized, and the skin  approximated using staples.  The umbilical repair was done by Dr. Lindie Spruce.  Please see his dictated note.  Specimen was placenta which was not sent to  pathology, and left fallopian tube and a portion of the right fallopian  tube, both of which were sent to Pathology.  Estimated blood loss was 600  mL.  Intraoperative fluid is 3 L.  Urine output was 200 mL of clear yellow  urine.  Sponge and instrument counts x2 were correct.  Complication was  none.  The patient tolerated the procedure well and was transferred to the  recovery room in stable condition.      Maxie Better, M.D.  Electronically Signed     [place]/MEDQ  D:  12/11/2005  T:  12/11/2005  Job:  [id number]

## 2011-05-04 NOTE — Op Note (Signed)
NAME:  [name], [name]                        ACCOUNT NO.:  [id number]   MEDICAL RECORD NO.:  [id number]                   PATIENT TYPE:  INP   LOCATION:  NA                                   FACILITY:  [facility]   PHYSICIAN:  Lenoard Aden, M.D.             DATE OF BIRTH:  1966-06-29   DATE OF PROCEDURE:  02/16/2003  DATE OF DISCHARGE:                                 OPERATIVE REPORT   PREOPERATIVE DIAGNOSES:  1. Intrauterine pregnancy at 38+ weeks, footling breech in active labor with     meconium.  2. Spontaneous rupture of membranes.   POSTOPERATIVE DIAGNOSES:  1. Intrauterine pregnancy at 38+ weeks, footling breech in active labor with     meconium.  2. Spontaneous rupture of membranes.  3. Pelvic adhesions and right fundal fibroid.   PROCEDURE:  1. Primary low transverse cesarean section.  2. Lysis of adhesions.   SURGEON:  Lenoard Aden, M.D.   ANESTHESIA:  Spinal.   ANESTHESIOLOGIST:  John [name]. Maisie Fus, M.D.   ESTIMATED BLOOD LOSS:  800 mL.   COMPLICATIONS:  None.   DRAINS:  Foley.   COUNTS:  Correct.   DISPOSITION:  Patient to recovery in good condition.   FINDINGS:  Full term living female, bilateral club foot anomaly noted.  Pediatricians in attendance.  Apgars 7 and 9.  Thick meconium.  DeLee  suctioning performed.  Bilateral thick ovarian adhesions to the posterior  wall of the uterus.  Normal appearing tubes with tubal adhesions to the back  wall of the uterus on the left side specifically.  Right fundal intramural  fibroid about 6 x 6 cm.  Posterior wall adhesions to the back of the uterus  of the sigmoid colon and posterior cul-de-sac obliterated by these  adhesions.   DESCRIPTION OF PROCEDURE:  After being apprised of the risks of anesthesia,  infection, bleeding, injury to abdominal organs and need for repair, the  patient was brought to the operating room where she was administered [name]  spinal anesthetic without complications, prepped and draped  in the usual  sterile fashion and Foley catheter previously placed.  Consents previously  signed.  After achieving adequate anesthesia, [name] dilute Marcaine solution was  placed in the area of Pfannenstiel skin incision, 10 mL of 0.5% solution.  The Pfannenstiel skin incision  was made, carried down to the fascia which was nicked in the midline and  opened transversely using Mayo scissors.  The rectus muscles were dissected  sharply in the midline, peritoneum entered sharply and bladder blade placed.  Visceral peritoneum was scored in [name] smiling fashion and lower uterine  segment incised with curved hysterotomy incision was made.  Atraumatic  delivery from [name] footling breech position using the usual maneuvers and  flexion of the fetal vertex of [name] full term living female, 6 pounds, 9 ounces,  Apgars 7 and 9, DeLee suctioning performed.  Minimal meconium noted in  the  suction tubing.  No meconium below the cords as noted by pediatricians.  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  Uterus exteriorized.  As noted, [name] right fundal fibroid which is intramural.  Placenta was  delivered manually and intact.  The uterine incision is closed in two layers  using [name] 0 Monocryl.  Bladder flap inspected and found to be hemostatic.  Previous reports of left ovarian cyst revealed no evidence of [name] left ovarian  or right ovarian cyst, however, both ovaries are small and thickly adhesed  to the posterior wall of the uterus.  The posterior cul-de-sac is  obliterated by adhesions to the bowel in this fashion.  Both tubes appeared  within normal limits.  There are adhesions of the left tube that are  distorting it to the left posterior wall of  the uterus.  These filmy  adhesions are lysed sharply using electrocautery and good hemostasis is  noted along the lysis of left tubal adhesions.  The uterine incision is  found to be hemostatic.  The uterus is replaced.  Irrigation reveals good  hemostasis.  Bladder  flap is inspected and found to be hemostatic.  The  fascia is then closed using [name] 0 Vicryl in [name] continuous running fashion.  Skin closed using staples.  The patient tolerated the procedure well and is  transferred to the recovery room in good condition.                                               Lenoard Aden, M.D.    RJT/MEDQ  D:  02/16/2003  T:  02/16/2003  Job:  [id number]

## 2011-05-04 NOTE — Discharge Summary (Signed)
NAMESHAIA, PORATH              ACCOUNT NO.:  [id number]   MEDICAL RECORD NO.:  [id number]          PATIENT TYPE:  INP   LOCATION:  9157                          [facility]ILITY:  [facility]   PHYSICIAN:  Maxie Better, M.D.DATE OF BIRTH:  09-16-1966   DATE OF ADMISSION:  11/19/2005  DATE OF DISCHARGE:  11/28/2005                                 DISCHARGE SUMMARY   ADMISSION DIAGNOSES:  1.  Pharyngitis.  2.  Fetal tachycardia.  3.  Intrauterine gestation at 36 weeks.   DISCHARGE DIAGNOSES:  1.  Pharyngitis, resolved.  2.  Oligohydramnios, resolved.  3.  Intrauterine gestation at 37 weeks.   HISTORY OF PRESENT ILLNESS:  A 45 year old gravida 4, para 1-0-2-1 female at  52 weeks, presented with sore throat and was found to have fetal  tachycardia.  The patient had axillary temperature of 100.2.  Fetal heart  rate on monitoring was 170, reactive with no decelerations or contractions.   HOSPITAL COURSE:  The patient was hospitalized for IV fluids.  She was  starting Augmentin.  Throat culture had been obtained in the office.  The  patient was placed on continuous fetal monitoring and given Tylenol.  Ultrasound was performed during her hospitalization for intermittent  variable decelerations.  Amniotic fluid level of 7.3 was noted consistent  with oligohydramnios.  The patient was continued to be monitored and  remained in the office for continued IV hydration.  The patient had episodic  decelerations with contractions, but otherwise reactive tracing.  The  patient remained in the hospital due to these findings.  She continued  intermittent decelerations and resulted in the decision or proceed with  amniocentesis.  On November 22, 2005, the patient underwent an uncomplicated  amniocentesis.  The result was subsequently consistent with immature fetal  lungs and the patient was, therefore, continued with as an inpatient.  Amniotic fluid subsequently had improved.  Decision was made to continue  to  monitor the patient.  The patient subsequently had resolution of her  decelerations and has on hospital day #9, she was doing well enough to be  discharged home.  She had a reactive non-stress test.   DISPOSITION:  Home.   CONDITION:  Stable.   DISCHARGE MEDICATIONS:  Prenatal vitamins one p.o. daily.   FOLLOW UP:  November 30, 2005.   DISCHARGE INSTRUCTIONS:  Call for decreased fetal movement, labor,  spontaneous rupture of membranes, or vaginal bleeding.      Maxie Better, M.D.  Electronically Signed     [place]/MEDQ  D:  01/30/2006  T:  01/30/2006  Job:  [id number]

## 2011-05-04 NOTE — Op Note (Signed)
NAME:  [name], [name]                        ACCOUNT NO.:  [id number]   MEDICAL RECORD NO.:  [id number]                   PATIENT TYPE:  INP   LOCATION:  5743                                 FACILITY:  [facility]   PHYSICIAN:  Jimmye Norman III, M.D.               DATE OF BIRTH:  January 25, 1966   DATE OF PROCEDURE:  05/26/2004  DATE OF DISCHARGE:                                 OPERATIVE REPORT   PREOPERATIVE DIAGNOSIS:  Gallstone pancreatitis.   POSTOPERATIVE DIAGNOSIS:  Gallstone pancreatitis, acute cholecystitis.   PROCEDURE:  Laparoscopic cholecystectomy with interoperative cholangiogram.   SURGEON:  Jimmye Norman, M.D.   ASSISTANT:  Sandria Bales. Ezzard Standing, M.D.   ANESTHESIA:  General endotracheal anesthesia.   ESTIMATED BLOOD LOSS:  50 mL.   COMPLICATIONS:  None.   CONDITION:  Good.   INDICATIONS FOR PROCEDURE:  The patient is a 45 year old female with  elevated liver function tests and amylase and lipase and now comes in for an  elective laparoscopic cholecystectomy.   FINDINGS:  The patient had acute inflammation with adhesions to the  gallbladder omentum.  She also had large stones in the dome of the  gallbladder and a relatively large cystic duct.  Cholangiogram was normal  and showed flow into the duodenum with a common portal for the common bile  duct and the pancreatic duct going into the duodenum.  There was no  obstruction and no stones in the duct.   PROCEDURE IN DETAIL:  The patient was taken to the operating room and placed  on the table in supine position.  After an adequate endotracheal anesthesia  was administered, she was prepped and draped in the usual sterile manner  exposing the midline and the right upper quadrant.  A supraumbilical  curvilinear incision was made using a #11 blade.  This was taken down to the  midline fascia through which a Veress needle was passed into the peritoneal  cavity under direct vision and confirmed to be in position using the saline  test.  Once this was done, carbon dioxide insufflation was instilled into  the peritoneal cavity up to a maximal intra-abdominal pressure of 15 mmHg.  When this was done, a 12 mm cannula was passed under direct vision using the  Optiview and secured in position.  Carbon dioxide insufflation was  continued.  We then passed two right costal margin 5 mm cannulae and a  subxiphoid 11 mm cannula under direct vision.  The patient was placed in  reversed Trendelenburg, the left side was tilted down, and the dissection  begun.   The dome of the gallbladder was densely adhesed to omental adhesions which  we took down using blunt dissection with the electrocautery.  This freed up  the infundibulum which we subsequently retracted towards the anterior  abdominal wall and the right upper quadrant.  Once this was done, we were  able to dissect out the peritoneum overlying  the hepatoduodenal triangle and  the triangle of Calot isolating a 7-8 mm cystic duct and the cystic artery,  both which were endoclipped.  We endoclipped the cystic duct along the  gallbladder side and then I made a a cholecystodochotomy using the  endoscopic scissors.  We were able to pass a Cook catheter into the  cholecystodochotomy and performed cholangiogram conforming flow into the  duodenum, no proximal or distal obstruction, normal size duct, and no common  duct stones.   Once this was completed, we removed the catheter and the clip that had held  it in place.  We then clipped the distal cystic duct using endoclips x 3 and  then transected the cystic duct.  We dissected off the gallbladder from its  bed with minimal difficulty.  Once that was completed, we removed the  cannulae after irrigating with 2.5 to 3 liters of saline solution.  We  closed the supraumbilical site using a figure-of-eight stitch of 0 Vicryl.  The skin was closed using a running subcuticular stitch of Vicryl.  All  needle counts, sponge counts, and  instrument counts were correct.  The  patient was then taken to the recovery room in stable condition with sterile  dressings applied.                                               Kathrin Ruddy, M.D.    JW/MEDQ  D:  05/26/2004  T:  05/26/2004  Job:  [id number]   cc:   Marcene Duos, M.D.  [address]  [place]  [place] [address]  Fax: [phone]   Petra Kuba, M.D.  [address], [address]  [place]  [place] [address]  Fax: [phone]
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
## 2011-05-04 NOTE — Op Note (Signed)
[name], [name]              ACCOUNT NO.:  [id number]   MEDICAL RECORD NO.:  [id number]          PATIENT TYPE:  INP   LOCATION:  9105                          [facility]   PHYSICIAN:  Cherylynn Ridges, M.D.    DATE OF BIRTH:  [birth year]   DATE OF PROCEDURE:  12/11/2005  DATE OF DISCHARGE:                                 OPERATIVE REPORT   PREOPERATIVE DIAGNOSIS:  Umbilical hernia.   POSTOPERATIVE DIAGNOSIS:  Umbilical hernia.   PROCEDURE:  Primary repair of umbilical hernia without mesh.   ASSISTANT:  Maxie Better, M.D.   ANESTHESIA:  Spinal.   ESTIMATED BLOOD LOSS:  Less than 10 mL.   COMPLICATIONS:  None.   CONDITION:  Stable.   OPERATION:  The patient had just finished a C-section under spinal.  The  uterus was out but had been placed back in prior to primary closure of the  umbilical hernia,which we repaired from the outside.   A supraumbilical curvilinear incision was made using a #15 blade and taken  down to the subcutaneous tissue.  I then used Metzenbaum scissors to dissect  out the hernia sac up to the fascial edges using Metzenbaum scissors.  We  had to resect two pieces of omentum prior to closure of the fascial defect,  which was done between hemostat clamps and 3-0 Vicryl ties.  Once this was  done, we closed the hernia repair of the umbilical hernia directly by  reapproximating the fascia using interrupted #1 Novofil sutures.  Three  interrupted sutures were placed and then we ran it from the right to the  left side using a running #1 Novofil and attaching it to one of the simple  stitches by tying it down on the left side.  We irrigated with saline,  cauterized for hemostasis, and closed the skin using a running subcuticular  stitch of 4-0 Monocryl.  All counts were correct.      Cherylynn Ridges, M.D.  Electronically Signed     JOW/MEDQ  D:  12/11/2005  T:  12/11/2005  Job:  [id number]   cc:   Maxie Better, M.D.  Fax: [phone]

## 2011-05-04 NOTE — H&P (Signed)
NAME:  [name], [name]                        ACCOUNT NO.:  [id number]   MEDICAL RECORD NO.:  [id number]                   PATIENT TYPE:  INP   LOCATION:  5735                                 FACILITY:  [facility]   PHYSICIAN:  Marcene Duos, M.D.         DATE OF BIRTH:  1966-05-13   DATE OF ADMISSION:  05/24/2004  DATE OF DISCHARGE:                                HISTORY & PHYSICAL   CHIEF COMPLAINT:  Abdominal pain.   HISTORY OF PRESENT ILLNESS:  [name] is a 45 year old female who  presented to my office yesterday with a complaint of epigastric and some  right upper quadrant pain beginning about a week ago, after eating a lot of  heavy food at the [facility] for her anniversary, also had a glass of wine  with that.  The following morning, she developed a fair amount of epigastric  pain.  She states she began popping Tums which seemed to help initially  but does not seem to be helping now.  She has had no definite reflux or  heartburn type symptoms that go up into her chest.  No radiation to her  back.  She can not say that the pain is any worse in any particular  position, but when she does eat, just about everything at this point, seems  to make it a bit worse.  Though she has continued to eat and drink.  She  states she had something similar to this about 12 years ago.  She stopped  eating fried and fatty foods and was put on Tagamet which resolved the  issue.  She does have a grandmother with a history of gallbladder disease  but no one else that she is aware of.  Bowel movements have been really  fairly normal.  She thought her urine looked maybe a bit dark this morning.  The patient also described a __________  of her right middle finger that she  opened up with a sterilized needle, and she feels that this is healing  though the skin is thickened where it was.  Laboratory obtained late  yesterday evening showed an amylase elevated at 166, lipase of 99, a glucose  of  111.  Electrolytes were normal.  Her total bilirubin was 1.8 with an alk  phos of 266.  AST 1,099, ALT 1, 054.  Because her exam was so benign  yesterday and she was continuing to eat and drink, we had her followup today  for repeat labs to see if she had possibly passed a gallstone.  She did have  an abdominal ultrasound today that did show multiple gallstones.  The common  bile duct was not dilated, however, and preliminary report did not show a  thickened gallbladder wall.  Today laboratory, however, just returned with  an amylase of 4,051, lipase is needing to be diluted because of its  elevation.  Her total bilirubin is now up to 3.2 with  a direct of 2.1, while  her transaminases are 576 and 948, AST and ALT respectively.  Alk phos is up  to 327 today.   PAST MEDICAL HISTORY:  1. The patient had delivery of an infant this past year.  2. Acne.   PAST SURGICAL HISTORY:  1. TAB at age 15.  2. Wisdom teeth extraction, 1997.   CURRENT MEDICATIONS:  1. Prenatal vitamins.  2. Tums.   ALLERGIES:  No known drug allergies.   FAMILY HISTORY:  Mother age 65 with history of coronary artery disease,  obesity.  Father age 1 healthy, but his family does have a history of  diabetes.  Sister age 52 with a history of encephalitis and seizures.  Sister age 2 that is healthy, and her son who is less than a year with a  history of bilateral club feet but otherwise healthy.   SOCIAL HISTORY:  The patient has been married since May 18, 2001.  She is a  hair stylist.  Tobacco use:  Quit December 2002.  Smoked about two  cigarettes a day previously.  Alcohol:  Occasional use.  Recreational drugs:  MJ in college.  Caffeine:  Occasional cup of coffee.  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   PHYSICAL EXAMINATION:  VITAL SIGNS:  Yesterday her blood pressure was  120/64, temp 98.3, pulse 80, weight 164 pounds.  HEENT:  Pupils equal, round and reactive to light.  Extraocular  movements  intact.  Tympanic membranes are clear.  Throat without injection.  NECK:  Supple without adenopathy.  CHEST:  Clear.  CARDIOVASCULAR:  Regular rate and rhythm without murmur or rub.  ABDOMEN:  Soft.  She had mild epigastric and minimal right upper quadrant  tenderness.  No definite Murphy sign.  No rebound or peritoneal signs.  Abdomen is soft.  Bowel sounds are present throughout.  No organomegaly or  masses appreciated.  EXTREMITIES:  She did also have a thickening on her right middle finger  lateral nailbed callus like skin.  No erythema or swelling there.  Otherwise  extremities without edema.   ASSESSMENT/PLAN:  The patient has developed probable gallstone pancreatitis  at this point, worsening gallstone pancreatitis at this point, though she  continues to have just similar symptoms to what she has had the past week.  I am going to admit her.  I have discussed her with Dr. Currie Paris.  Will consult [facility] for possible ERCP and then following  that a probable laparoscopic cholecystectomy.  We will also begin  intravenous antibiotics, intravenous rehydration, and make her nothing by  mouth with intravenous pain control as well.                                                Marcene Duos, M.D.    EMM/MEDQ  D:  05/24/2004  T:  05/25/2004  Job:  [id number]

## 2011-05-04 NOTE — Discharge Summary (Signed)
[name], [name]                        ACCOUNT NO.:  [id number]   MEDICAL RECORD NO.:  [id number]                   PATIENT TYPE:  INP   LOCATION:  5743                                 FACILITY:  [facility]   PHYSICIAN:  Marcene Duos, M.D.         DATE OF BIRTH:  05-May-1966   DATE OF ADMISSION:  05/24/2004  DATE OF DISCHARGE:  05/27/2004                                 DISCHARGE SUMMARY   ADMISSION DIAGNOSIS:  Gallstone pancreatitis.   DISCHARGE DIAGNOSIS:  Gallstone pancreatitis.   PROCEDURE:  Laparoscopic cholecystectomy, May 26, 2004.   CONSULTATIONS:  Jimmye Norman, M.D., May 24, 2004.  Petra Kuba, M.D., May 24, 2004.   HISTORY OF PRESENT ILLNESS AND HOSPITAL COURSE:  The patient is a 45-year-  old female who presented to my office with one week of epigastric pain,  after a heavy meal for her anniversary.  She had minimal right upper  quadrant pain, no radiation to the back, fever or chills, or change habits.  She had similar pain 12 years ago and improved with dietary changes and  Tagamet.  The patient was noted initially to have a minimal elevation of  amylase and lipase, lipase at 99.  AST ALT greater than 1,000, ALP 200+ and  a total bilirubin of 1.8.  Ultrasound, the following day - the day of  admission, showed multiple gallstones, a normal but contracted gallbladder,  normal pancreas, and that her common bile duct was not dilated.  Her white  count was not elevated as well.  On repeat laboratory the day of admission,  white count was 7.0, the hemoglobin 11.7.  AST 285, ALT 647, ALP 285.  Total  bilirubin 1.4.  Amylase was now up to 2,252 with a lipase of 2,074.  Urinalysis was normal.  The patient's exam, however, was quite benign with  mild epigastric and right upper quadrant tenderness.  No rebound or  peritoneal signs, and she was afebrile.  The following day - day of  admission, repeat labs:  White count was 7.0, the hemoglobin 11.7.  AST 285,  ALT  647, ALP 285.  Total bilirubin 1.4.  Amylase was now up to 2,252 with a  lipase of 2,074.  Urinalysis was normal.  The patient was admitted, made  NPO, and place on IV fluids.  GI and general surgery were consulted.  It was  decided to proceed with laparoscopic cholecystectomy when her pancreatic  enzymes dropped the following day, and because she appeared so stable.  Laparoscopic cholecystectomy was performed on May 26, 2004, Dr. Lindie Spruce.  The  patient tolerated this well and was in stable condition with improving  laboratory.  On May 27, 2004, amylase of 134, lipase of 89, total bilirubin  0.7, alk phos 198, and was taking p.o. with resolving pancreatitis and it  was felt that she could be discharged to home.  The patient was, thus,  discharged in stable condition.  I am unable to find her discharge medication list in her record at this  time.                                                Marcene Duos, M.D.    EMM/MEDQ  D:  08/05/2004  T:  08/06/2004  Job:  [id number]

## 2011-05-04 NOTE — Discharge Summary (Signed)
NAMERUBIE, FICCO              ACCOUNT NO.:  [id number]   MEDICAL RECORD NO.:  [id number]          PATIENT TYPE:  INP   LOCATION:  9105                          [facility]   PHYSICIAN:  Maxie Better, M.D.DATE OF BIRTH:  1966-11-01   DATE OF ADMISSION:  12/11/2005  DATE OF DISCHARGE:  12/14/2005                                 DISCHARGE SUMMARY   ADMITTING DIAGNOSES:  1.  Umbilical hernia.  2.  Previous cesarean section.  3.  Term gestation.  4.  Desires sterilization.   DISCHARGE DIAGNOSES:  1.  Term gestation, delivered.  2.  Previous cesarean section.  3.  Umbilical hernia, repaired.  4.  Desires sterilization.   PROCEDURE:  1.  Umbilical hernia repair.  2.  Repeat cesarean section.  3.  Left salpingectomy.  4.  Right partial salpingectomy.   HOSPITAL COURSE:  The patient is admitted to [facility].  She  underwent a repeat cesarean section on December 11, 2005.  The procedure  resulted in delivery of a live female, Apgars of 8 and 9, 7 pounds 6 ounces.  During the procedure the patient had her left tube removed and the right  partially removed consistent with permanent sterilization.  Umbilical hernia  repair was performed by Dr. Lindie Spruce.  The patient had an unremarkable  postoperative course.  She received RhoGAM during her hospitalization.  CBC  on postoperative day #1 showed a hemoglobin 9.5, hematocrit 28.9, white  count 11.9, platelet count 164,000.  By postoperative day #3 the patient was  deemed well to be discharged.   DISPOSITION:  Home.   CONDITION ON DISCHARGE:  Stable.   DISCHARGE MEDICATIONS:  1.  Percocet #30 one p.o. q.4h. p.r.n. pain.  2.  Chromagen Forte one p.o. daily.  3.  Prenatal vitamins one p.o. daily.   FOLLOW-UP:  [facility] in four to six weeks and as per Dr. Lindie Spruce  we  will schedule her for an umbilical hernia repair.   DISCHARGE INSTRUCTIONS:  Per postpartum booklet given to the patient.      Maxie Better, M.D.  Electronically Signed     /MEDQ  D:  01/30/2006  T:  01/30/2006  Job:  [id number]

## 2013-08-04 ENCOUNTER — Other Ambulatory Visit: Payer: Self-pay | Admitting: Family Medicine

## 2013-08-04 DIAGNOSIS — E010 Iodine-deficiency related diffuse (endemic) goiter: Secondary | ICD-10-CM

## 2013-08-10 ENCOUNTER — Ambulatory Visit
Admission: RE | Admit: 2013-08-10 | Discharge: 2013-08-10 | Disposition: A | Discharge status: Home / Self Care | Payer: BC Managed Care – PPO | Source: Ambulatory Visit | Attending: Family Medicine | Admitting: Family Medicine

## 2013-08-10 DIAGNOSIS — E010 Iodine-deficiency related diffuse (endemic) goiter: Secondary | ICD-10-CM

## 2017-02-21 ENCOUNTER — Encounter: Payer: Self-pay | Admitting: Family

## 2018-01-22 ENCOUNTER — Encounter: Payer: Self-pay | Admitting: Family Medicine

## 2018-01-22 ENCOUNTER — Ambulatory Visit (INDEPENDENT_AMBULATORY_CARE_PROVIDER_SITE_OTHER): Payer: BLUE CROSS/BLUE SHIELD | Admitting: Family Medicine

## 2018-01-22 VITALS — BP 110/60 | HR 74 | Temp 98.0°F | Ht 64.5 in | Wt 206.2 lb

## 2018-01-22 DIAGNOSIS — Z0000 Encounter for general adult medical examination without abnormal findings: Secondary | ICD-10-CM

## 2018-01-22 DIAGNOSIS — Z1329 Encounter for screening for other suspected endocrine disorder: Secondary | ICD-10-CM

## 2018-01-22 DIAGNOSIS — Z1211 Encounter for screening for malignant neoplasm of colon: Secondary | ICD-10-CM

## 2018-01-22 DIAGNOSIS — Z131 Encounter for screening for diabetes mellitus: Secondary | ICD-10-CM

## 2018-01-22 DIAGNOSIS — Z13220 Encounter for screening for lipoid disorders: Secondary | ICD-10-CM | POA: Diagnosis not present

## 2018-01-22 LAB — BASIC METABOLIC PANEL
BUN: 13 mg/dL (ref 6–23)
CALCIUM: 9 mg/dL (ref 8.4–10.5)
CO2: 29 mEq/L (ref 19–32)
Chloride: 107 mEq/L (ref 96–112)
Creatinine, Ser: 0.8 mg/dL (ref 0.40–1.20)
GFR: 96.97 mL/min (ref >60.00)
Glucose, Bld: 94 mg/dL (ref 70–99)
Potassium: 4 mEq/L (ref 3.5–5.1)
SODIUM: 141 meq/L (ref 135–145)

## 2018-01-22 LAB — HEMOGLOBIN A1C: Hgb A1c MFr Bld: 6.3 % (ref 4.6–6.5)

## 2018-01-22 LAB — CBC WITH DIFFERENTIAL/PLATELET
BASOS ABS: 0 10*3/uL (ref 0.0–0.1)
Basophils Relative: 0.9 % (ref 0.0–3.0)
Eosinophils Absolute: 0.2 10*3/uL (ref 0.0–0.7)
Eosinophils Relative: 4.8 % (ref 0.0–5.0)
HEMATOCRIT: 37 % (ref 36.0–46.0)
Hemoglobin: 11.9 g/dL — ABNORMAL LOW (ref 12.0–15.0)
LYMPHS PCT: 41.6 % (ref 12.0–46.0)
Lymphs Abs: 2 10*3/uL (ref 0.7–4.0)
MCHC: 32.2 g/dL (ref 30.0–36.0)
MCV: 89.2 fl (ref 78.0–100.0)
MONOS PCT: 5.6 % (ref 3.0–12.0)
Monocytes Absolute: 0.3 10*3/uL (ref 0.1–1.0)
NEUTROS ABS: 2.3 10*3/uL (ref 1.4–7.7)
Neutrophils Relative %: 47.1 % (ref 43.0–77.0)
PLATELETS: 146 10*3/uL — AB (ref 150.0–400.0)
RBC: 4.15 Mil/uL (ref 3.87–5.11)
RDW: 15.4 % (ref 11.5–15.5)
WBC: 4.8 10*3/uL (ref 4.0–10.5)

## 2018-01-22 LAB — LIPID PANEL
Cholesterol: 148 mg/dL (ref 0–200)
HDL: 56.4 mg/dL (ref >39.00)
LDL Cholesterol: 82 mg/dL (ref 0–99)
NonHDL: 91.74
TRIGLYCERIDES: 49 mg/dL (ref 0.0–149.0)
Total CHOL/HDL Ratio: 3
VLDL: 9.8 mg/dL (ref 0.0–40.0)

## 2018-01-22 LAB — T4, FREE: Free T4: 0.86 ng/dL (ref 0.60–1.60)

## 2018-01-22 LAB — TSH: TSH: 2.25 u[IU]/mL (ref 0.35–4.50)

## 2018-01-22 NOTE — Patient Instructions (Addendum)
Preventive Care 40-64 Years, Female Preventive care refers to lifestyle choices and visits with your health care provider that can promote health and wellness. What does preventive care include?  A yearly physical exam. This is also called an annual well check.  Dental exams once or twice a year.  Routine eye exams. Ask your health care provider how often you should have your eyes checked.  Personal lifestyle choices, including: ? Daily care of your teeth and gums. ? Regular physical activity. ? Eating a healthy diet. ? Avoiding tobacco and drug use. ? Limiting alcohol use. ? Practicing safe sex. ? Taking low-dose aspirin daily starting at age 58. ? Taking vitamin and mineral supplements as recommended by your health care provider. What happens during an annual well check? The services and screenings done by your health care provider during your annual well check will depend on your age, overall health, lifestyle risk factors, and family history of disease. Counseling Your health care provider may ask you questions about your:  Alcohol use.  Tobacco use.  Drug use.  Emotional well-being.  Home and relationship well-being.  Sexual activity.  Eating habits.  Work and work Statistician.  Method of birth control.  Menstrual cycle.  Pregnancy history.  Screening You may have the following tests or measurements:  Height, weight, and BMI.  Blood pressure.  Lipid and cholesterol levels. These may be checked every 5 years, or more frequently if you are over 81 years old.  Skin check.  Lung cancer screening. You may have this screening every year starting at age 78 if you have a 30-pack-year history of smoking and currently smoke or have quit within the past 15 years.  Fecal occult blood test (FOBT) of the stool. You may have this test every year starting at age 65.  Flexible sigmoidoscopy or colonoscopy. You may have a sigmoidoscopy every 5 years or a colonoscopy  every 10 years starting at age 30.  Hepatitis C blood test.  Hepatitis B blood test.  Sexually transmitted disease (STD) testing.  Diabetes screening. This is done by checking your blood sugar (glucose) after you have not eaten for a while (fasting). You may have this done every 1-3 years.  Mammogram. This may be done every 1-2 years. Talk to your health care provider about when you should start having regular mammograms. This may depend on whether you have a family history of breast cancer.  BRCA-related cancer screening. This may be done if you have a family history of breast, ovarian, tubal, or peritoneal cancers.  Pelvic exam and Pap test. This may be done every 3 years starting at age 80. Starting at age 36, this may be done every 5 years if you have a Pap test in combination with an HPV test.  Bone density scan. This is done to screen for osteoporosis. You may have this scan if you are at high risk for osteoporosis.  Discuss your test results, treatment options, and if necessary, the need for more tests with your health care provider. Vaccines Your health care provider may recommend certain vaccines, such as:  Influenza vaccine. This is recommended every year.  Tetanus, diphtheria, and acellular pertussis (Tdap, Td) vaccine. You may need a Td booster every 10 years.  Varicella vaccine. You may need this if you have not been vaccinated.  Zoster vaccine. You may need this after age 5.  Measles, mumps, and rubella (MMR) vaccine. You may need at least one dose of MMR if you were born in  1957 or later. You may also need a second dose.  Pneumococcal 13-valent conjugate (PCV13) vaccine. You may need this if you have certain conditions and were not previously vaccinated.  Pneumococcal polysaccharide (PPSV23) vaccine. You may need one or two doses if you smoke cigarettes or if you have certain conditions.  Meningococcal vaccine. You may need this if you have certain  conditions.  Hepatitis A vaccine. You may need this if you have certain conditions or if you travel or work in places where you may be exposed to hepatitis A.  Hepatitis B vaccine. You may need this if you have certain conditions or if you travel or work in places where you may be exposed to hepatitis B.  Haemophilus influenzae type b (Hib) vaccine. You may need this if you have certain conditions.  Talk to your health care provider about which screenings and vaccines you need and how often you need them. This information is not intended to replace advice given to you by your health care provider. Make sure you discuss any questions you have with your health care provider. Document Released: 12/30/2015 Document Revised: 08/22/2016 Document Reviewed: 10/04/2015 Elsevier Interactive Patient Education  2018 Elsevier Inc.  

## 2018-01-22 NOTE — Progress Notes (Signed)
Patient presents to clinic today for CPE and to establish care.  SUBJECTIVE: PMH:  Pt is a 52 yo with pmh sig for carcinoid tumor of ileum.  Pt was previously seen [facility].  Carcinoid tumor of ileum: -underwent ex-lap with a modified radical hysterectomy, bilateral salpingo-oophorectomy and excision of a nodule. -[facility]  Last appt 09/03/17. -pt was getting yearly CT scan, but had an allergic rxn to the contrast.  She was given benadryl for hives.  She was advised to take benadryl 1 hr prior to any study requiring contrast and to take prednisone 10 mg prior to imaging. -Of note a few small pulmonary nodules were noted on CT scan in 2010.  They are currently being watched. -Given the contrast allergy, now getting blood work checked frequently. -Pt taking Premerin s/p hysterectomy.  May take it q 3-4 days.  Pt states she is aware of the risk but is not ready to stop the medication yet.  Seasonal allergies: -We will take Zyrtec as needed  Allergies: CT contrast-hives.  Does okay when premedicated with Benadryl and prednisone  Past surgical history: Cholecystectomy 2005 Hysterectomy 2010 Partial colon resection C-section x2  2004, 2006  Social history: Patient has been married for 16 years.  She has 2 children a 16109 year old and a 65109 year old.  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  Patient is a former smoker.  She quit smoking cigarettes 16 years ago.  She described herself as a social smoker.  Patient endorses occasional wine drinking.  Patient denies drug use.  Family medical history: Mom-Arthritis, DM, HTN Dad-unsure Sister-Angela Sister-Donna  Health Maintenance: Dental --[employer] Immunizations --tetanus 2012? Colonoscopy --2010 Mammogram --2018 PAP -- 2013   History reviewed. No pertinent past medical history.  Past Surgical History:  Procedure Laterality Date  .  ABDOMINAL HYSTERECTOMY    . CESAREAN SECTION    . CHOLECYSTECTOMY      Current Outpatient Medications on File Prior to Visit  Medication Sig Dispense Refill  . Estrogens Conjugated (PREMARIN PO) Take by mouth.     No current facility-administered medications on file prior to visit.     Allergies  Allergen Reactions  . Ioversol Hives    Other reaction(s): Laryngeal Edema (ALLERGY)    History reviewed. No pertinent family history.  Social History   Socioeconomic History  . Marital status: Married    Spouse name: Not on file  . Number of children: Not on file  . Years of education: Not on file  . Highest education level: Not on file  Social Needs  . Financial resource strain: Not on file  . Food insecurity - worry: Not on file  . Food insecurity - inability: Not on file  . Transportation needs - medical: Not on file  . Transportation needs - non-medical: Not on file  Occupational History  . Not on file  Tobacco Use  . Smoking status: Former Games developermoker  . Smokeless tobacco: Never Used  . Tobacco comment: quit 15 years ago (social smoker)  Substance and Sexual Activity  . Alcohol use: Yes    Comment: ocass.   . Drug use: No  . Sexual activity: Not on file  Other Topics Concern  . Not on file  Social History Narrative  . Not on file    ROS General: Denies fever, chills, night sweats, changes in weight, changes in appetite HEENT: Denies headaches, ear pain, changes in vision, rhinorrhea,  sore throat CV: Denies CP, palpitations, SOB, orthopnea Pulm: Denies SOB, cough, wheezing GI: Denies abdominal pain, nausea, vomiting, diarrhea, constipation GU: Denies dysuria, hematuria, frequency, vaginal discharge Msk: Denies muscle cramps, joint pains Neuro: Denies weakness, numbness, tingling Skin: Denies rashes, bruising Psych: Denies depression, anxiety, hallucinations  BP 110/60 (BP Location: Left Arm, Patient Position: Sitting, Cuff Size: Large)   Pulse 74   Temp 98 F  (36.7 C) (Oral)   Ht 5' 4.5" (1.638 m)   Wt 206 lb 3.2 oz (93.5 kg)   BMI 34.85 kg/m   Physical Exam Gen. Pleasant, well developed, well-nourished, in NAD HEENT - Uncertain/AT, PERRL, no scleral icterus, no nasal drainage, pharynx without erythema or exudate. Neck: No JVD, mildly enlarged L lobe of thyroid, Non tender, no nodules noted.  No carotid bruits Lungs: no use of accessory muscles, CTAB, no wheezes, rales or rhonchi Cardiovascular: RRR, No r/g/m, no peripheral edema Abdomen: BS present, soft, nontender,nondistended Musculoskeletal: No deformities, moves all four extremities, no cyanosis or clubbing, normal tone Neuro:  A&Ox3, CN II-XII intact, normal gait Skin:  Warm, dry, intact, no lesions Psych: normal affect, mood appropriate  No results found for this or any previous visit (from the past 2160 hour(s)).  Assessment/Plan: Well adult exam  -Anticipatory guidance given including wearing seatbelts, smoke detectors in the home, increasing physical activity, increasing p.o. intake of water, increasing p.o. intake of vegetables -Recommend pelvic exam/Pap to monitor cells of the vaginal cuff for changes. -Continue f/u with [facility]. -Mammogram up to date. - Plan: CBC with Differential/Platelet, Basic metabolic panel, CBC with Differential/Platelet  Screening for cholesterol level  - Plan: Lipid panel  Screening for diabetes mellitus  - Plan: Hemoglobin A1c  Screen for colon cancer  - Plan: Ambulatory referral to Gastroenterology  Screening for thyroid disorder  -given mildly enlarged L lobe of thyroid will obtain labs. -consider thyroid u/s  - Plan: TSH, T4, free    F/u prn  Abbe Amsterdam, MD

## 2018-04-23 ENCOUNTER — Encounter: Payer: Self-pay | Admitting: Family Medicine

## 2018-12-15 ENCOUNTER — Telehealth: Payer: Self-pay | Admitting: Family Medicine

## 2018-12-15 NOTE — Telephone Encounter (Signed)
Estrogens Conjugated (Premarin PO) previously ordered by historical provider.  LOV with Dr. Salomon FickBanks on 01/22/18

## 2018-12-15 NOTE — Telephone Encounter (Signed)
Copied from CRM [phone]#[id number]. Topic: Quick Communication - Rx Refill/Question >> Dec 15, 2018 12:04 PM Burchel, Abbi R wrote: Medication: Estrogens Conjugated (PREMARIN PO)  Preferred Pharmacy: [id number]- [place], [place] [address] [place] [id number] Phone: [phone] Fax: [phone]    Pt was advised that RX refills may take up to 3 business days. We ask that you follow-up with your pharmacy.

## 2018-12-16 NOTE — Telephone Encounter (Signed)
Please Advise pt LOV was 01/22/2018

## 2019-06-01 ENCOUNTER — Telehealth: Payer: Self-pay | Admitting: Family Medicine

## 2019-06-01 NOTE — Telephone Encounter (Signed)
Patient is wanting a refill on her Premarin - lowest dosage  Pharmacy- [facility] on [address] and [address]

## 2019-06-03 NOTE — Telephone Encounter (Signed)
Pt request for lowest dose for Premarin please advise

## 2019-06-07 NOTE — Telephone Encounter (Signed)
Pt should be following with OB/Gyn for this.  Advised of possible risk given cancer hx.

## 2019-06-09 NOTE — Telephone Encounter (Signed)
Pt states that  She does not need to see OB / Gyn since she had full hysterectomy. States that she does not see the need to see one. Please advise

## 2019-07-15 ENCOUNTER — Ambulatory Visit: Payer: BLUE CROSS/BLUE SHIELD | Admitting: Family Medicine

## 2019-11-25 ENCOUNTER — Other Ambulatory Visit: Payer: Self-pay

## 2019-11-25 DIAGNOSIS — Z20822 Contact with and (suspected) exposure to covid-19: Secondary | ICD-10-CM

## 2019-11-26 LAB — NOVEL CORONAVIRUS, NAA: SARS-CoV-2, NAA: NOT DETECTED

## 2019-12-31 ENCOUNTER — Encounter: Payer: Self-pay | Admitting: Family Medicine

## 2019-12-31 ENCOUNTER — Other Ambulatory Visit: Payer: Self-pay

## 2019-12-31 ENCOUNTER — Ambulatory Visit (INDEPENDENT_AMBULATORY_CARE_PROVIDER_SITE_OTHER): Payer: BC Managed Care – PPO | Admitting: Family Medicine

## 2019-12-31 VITALS — BP 110/78 | HR 76 | Temp 97.9°F | Wt 197.0 lb

## 2019-12-31 DIAGNOSIS — M25511 Pain in right shoulder: Secondary | ICD-10-CM

## 2019-12-31 DIAGNOSIS — Z90710 Acquired absence of both cervix and uterus: Secondary | ICD-10-CM

## 2019-12-31 DIAGNOSIS — M79671 Pain in right foot: Secondary | ICD-10-CM

## 2019-12-31 DIAGNOSIS — G8929 Other chronic pain: Secondary | ICD-10-CM

## 2019-12-31 DIAGNOSIS — R3915 Urgency of urination: Secondary | ICD-10-CM

## 2019-12-31 DIAGNOSIS — Z23 Encounter for immunization: Secondary | ICD-10-CM | POA: Diagnosis not present

## 2019-12-31 LAB — POC URINALSYSI DIPSTICK (AUTOMATED)
Bilirubin, UA: NEGATIVE
Blood, UA: NEGATIVE
Glucose, UA: NEGATIVE
Ketones, UA: NEGATIVE
Leukocytes, UA: NEGATIVE
Nitrite, UA: NEGATIVE
Protein, UA: NEGATIVE
Spec Grav, UA: 1.01 (ref 1.010–1.025)
Urobilinogen, UA: 0.2 E.U./dL
pH, UA: 6 (ref 5.0–8.0)

## 2019-12-31 NOTE — Progress Notes (Signed)
Subjective:    Patient ID: [name], female    DOB: [birth year], 54 y.o.   MRN: [id number]  No chief complaint on file.   HPI Patient was seen today for ongoing concerns.  She notes intermittent R heel pain. 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  Pt endorses R shoulder pain.  Denies any falls, pops, clicks, or recent injury. Recalls reaching out for an item prior to start of symptoms.  At times sleeps with her arm raised above her head.  Pt notes ongoing urinary urgency, little volume produced, decreased output, irritation.  Denies vaginal d/c, suprapubic pain, back pain, n/v, fever, chills.  Pt drinking a few bottles of water/day, 12 oz of coffee in am.  Endorses urinary leakage x 1-2 yrs, s/p hysterectomy.    No past medical history on file.  Allergies  Allergen Reactions  . Ioversol Hives    Other reaction(s): Laryngeal Edema (ALLERGY)    ROS General: Denies fever, chills, night sweats, changes in weight, changes in appetite HEENT: Denies headaches, ear pain, changes in vision, rhinorrhea, sore throat CV: Denies CP, palpitations, SOB, orthopnea Pulm: Denies SOB, cough, wheezing GI: Denies abdominal pain, nausea, vomiting, diarrhea, constipation GU: Denies dysuria, hematuria, vaginal discharge  +frequency, urgency, decreased urine volume, leakage Msk: Denies muscle cramps, joint pains  +R shoulder pain, R heel pain Neuro: Denies weakness, numbness, tingling Skin: Denies rashes, bruising Psych: Denies depression, anxiety, hallucinations    Objective:    Blood pressure 110/78, pulse 76, temperature 97.9 F (36.6 C), temperature source Temporal, weight 197 lb (89.4 kg), SpO2 97 %.  Gen. Pleasant, well-nourished, in no distress, normal affect   HEENT: /AT, face symmetric, no scleral icterus, PERRLA, EOMI, nares patent without drainage Lungs: no accessory muscle use Cardiovascular: RRR,  no peripheral edema Abdomen: BS present, soft, NT/ND, no CVA  tenderness. Musculoskeletal: No TTP of cervical, thoracic, and lumbar spine or paraspinal muscles.  TTP of R tricep.  Normal active and passive ROM in b/l shoulders.  Negative Neers, Hawkins, empty can.  No deformities, no cyanosis or clubbing, normal tone. Neuro:  A&Ox3, CN II-XII intact, normal gait Skin:  Warm, no lesions/ rash   Wt Readings from Last 3 Encounters:  01/22/18 206 lb 3.2 oz (93.5 kg)    Lab Results  Component Value Date   WBC 4.8 01/22/2018   HGB 11.9 (L) 01/22/2018   HCT 37.0 01/22/2018   PLT 146.0 (L) 01/22/2018   GLUCOSE 94 01/22/2018   CHOL 148 01/22/2018   TRIG 49.0 01/22/2018   HDL 56.40 01/22/2018   LDLCALC 82 01/22/2018   NA 141 01/22/2018   K 4.0 01/22/2018   CL 107 01/22/2018   CREATININE 0.80 01/22/2018   BUN 13 01/22/2018   CO2 29 01/22/2018   TSH 2.25 01/22/2018   HGBA1C 6.3 01/22/2018    Assessment/Plan:  Urinary urgency  -discussed possible causes including bladder prolapse -will obtain UA -f/u with OB/Gyn given h/o hysterectomy - Plan: POCT Urinalysis Dipstick (Automated)  S/P hysterectomy  Acute pain of right shoulder -likely 2/2 muscle strain -supportive care  Chronic heel pain, right -heel spur vs calcaneal bursitis -supportive care: NASIDs, stretching, supportive shoes. -imaging not indicated  Need for diphtheria-tetanus-pertussis (Tdap) vaccine  - Plan: Tdap vaccine greater than or equal to 7yo IM  F/u prn  Abbe Amsterdam, MD

## 2019-12-31 NOTE — Patient Instructions (Addendum)
You can take Tylenol or ibuprofen as needed for your right shoulder pain and right heel pain.  You can also use Biofreeze, Icy Hot, Aspercreme on the shoulder as needed.  You should wear supportive shoes while at work.  Your urine did not show any signs of infection.  Shoulder Pain Many things can cause shoulder pain, including:  An injury to the shoulder.  Overuse of the shoulder.  Arthritis. The source of the pain can be:  Inflammation.  An injury to the shoulder joint.  An injury to a tendon, ligament, or bone. Follow these instructions at home: Pay attention to changes in your symptoms. Let your health care provider know about them. Follow these instructions to relieve your pain. If you have a sling:  Wear the sling as told by your health care provider. Remove it only as told by your health care provider.  Loosen the sling if your fingers tingle, become numb, or turn cold and blue.  Keep the sling clean.  If the sling is not waterproof: ? Do not let it get wet. Remove it to shower or bathe.  Move your arm as little as possible, but keep your hand moving to prevent swelling. Managing pain, stiffness, and swelling   If directed, put ice on the painful area: ? Put ice in a plastic bag. ? Place a towel between your skin and the bag. ? Leave the ice on for 20 minutes, 2-3 times per day. Stop applying ice if it does not help with the pain.  Squeeze a soft ball or a foam pad as much as possible. This helps to keep the shoulder from swelling. It also helps to strengthen the arm. General instructions  Take over-the-counter and prescription medicines only as told by your health care provider.  Keep all follow-up visits as told by your health care provider. This is important. Contact a health care provider if:  Your pain gets worse.  Your pain is not relieved with medicines.  New pain develops in your arm, hand, or fingers. Get help right away if:  Your arm, hand, or  fingers: ? Tingle. ? Become numb. ? Become swollen. ? Become painful. ? Turn white or blue. Summary  Shoulder pain can be caused by an injury, overuse, or arthritis.  Pay attention to changes in your symptoms. Let your health care provider know about them.  This condition may be treated with a sling, ice, and pain medicines.  Contact your health care provider if the pain gets worse or new pain develops. Get help right away if your arm, hand, or fingers tingle or become numb, swollen, or painful.  Keep all follow-up visits as told by your health care provider. This is important. This information is not intended to replace advice given to you by your health care provider. Make sure you discuss any questions you have with your health care provider. Document Revised: 06/17/2018 Document Reviewed: 06/17/2018 Elsevier Patient Education  2020 [employer]  Heel Pad Atrophy  Heel pad atrophy is a cause of heel pain. Your heels take much of the impact when you stand, walk, or run. A fat pad under your heel bone protects the bone and cushions it. Over time, the fat pad can break down and lose elasticity and size (atrophy). This causes heel pain due to decreased cushioning in your heel. In many cases, the pain occurs in both heels. You may feel an aching or burning pain that gets worse while you are walking or standing.  This pain may be worse for athletes who play sports on hard surfaces with high impact on their heels. What are the causes? This condition is caused by the gradual break down of the fat pad under the heel bone. What increases the risk? This condition is more likely to develop in people who:  Are age 56 or older.  Play a sport that involves jumping and landing on hard surfaces, such as in basketball.  Are runners, especially if they land on their heels first when they run.  Are overweight.  Have diabetes, rheumatoid arthritis, or blood vessel disease.  Get steroid  injections in the heel area.  Have had trauma to the heel area, such as falling from a height. What are the signs or symptoms? The most common symptom of this condition is burning or aching pain in one or both heels. The pain or tenderness may be worse when:  Walking or standing, especially on hard surfaces or for long amounts of time.  Walking barefoot.  Wearing hard-soled shoes.  Resting at night.  Pressing on the center of the heel. How is this diagnosed? This condition is diagnosed based on your symptoms, medical history, and a physical exam. Your health care provider will check your heel pad and see if you have tenderness in the center of your heel. You may also have an ultrasound to confirm the diagnosis and measure the thickness of your fat pad. How is this treated? Treatment for this condition includes:  Lessening the amount of time spent walking, standing, or running.  Avoiding activities that cause pain.  Taking an over-the-counter pain reliever or anti-inflammatory medicine.  Wearing shoes with thick heel cushioning or using a soft shoe insert (orthotic).  Wearing shoes at all times, even indoors.  Taping your heels to increase support.  Losing weight if you are overweight. Follow these instructions at home: Managing pain, stiffness, and swelling   If directed, put ice on the injured area. ? Put ice in a plastic bag. ? Place a towel between your heel and the bag. ? Leave the ice on for 20 minutes, 2-3 times a day.  Move your toes often to lessen stiffness and swelling.  Raise (elevate) the foot above the level of your heart while you are sitting or lying down. Activity  Return to your normal activities as told by your health care provider. Ask your health care provider what activities are safe for you.  Do exercises as told by your health care provider. General instructions  Take over-the-counter and prescription medicines only as told by your health  care provider.  Wear orthotics and supportive shoes as told by your health care provider. Do not wear high heels.  Do not use any products that contain nicotine or tobacco, such as cigarettes, e-cigarettes, and chewing tobacco. If you need help quitting, ask your health care provider.  If you are overweight, work with your health care provider to reach a healthy weight.  Keep all follow-up visits as told by your health care provider. This is important. How is this prevented?  Give your body time to rest between periods of activity.  Wear comfortable and supportive shoes during athletic activity.  Maintain a healthy weight.  Manage long-term (chronic) health conditions, such as diabetes, high blood pressure, or blood vessel disease. Contact a health care provider if:  Your symptoms do not improve or they get worse.  You have concerns about your orthotics and shoes. Summary  Heel pad atrophy is a  cause of heel pain.  This condition is caused by the gradual breakdown of the fat pad under the heel bone.  Treatment may include avoiding activities that cause pain, using a soft shoe insert (orthotic), and maintaining a healthy weight. This information is not intended to replace advice given to you by your health care provider. Make sure you discuss any questions you have with your health care provider. Document Revised: 12/08/2018 Document Reviewed: 12/08/2018 Elsevier Patient Education  2020 [employer]  Heel Pad Atrophy Rehab Ask your health care provider which exercises are safe for you. Do exercises exactly as told by your health care provider and adjust them as directed. It is normal to feel mild stretching, pulling, tightness, or discomfort as you do these exercises. Stop the exercise right away if you feel sudden pain or your pain gets worse. Do not begin these exercises until told by your health care provider. Stretching exercises These exercises improve the movement and  flexibility of your calf muscles. These exercises may also help to relieve pain and stiffness. Standing gastroc stretch This exercise is also called a standing calf (gastroc) stretch. 1. Stand with your hands against a wall. 2. Extend your left / right leg behind you, and bend your front knee slightly. Your heels should be on the floor. 3. Keeping your heels on the floor and your back knee straight, shift your weight toward the wall. You should feel a gentle stretch in the back of your lower leg (calf). 4. Hold this position for __________ seconds. Repeat __________ times. Complete this exercise __________ times a day. Gastroc and soleus stretch, standing This is an exercise in which you stand on a step and use your body weight to stretch your calf muscles. To do this exercise: 1. Stand with the ball of your left / right foot on a step. The ball of your foot is on the walking surface, right under your toes. 2. Keep your other foot firmly on the same step. 3. Hold on to the wall, a railing, or a chair for balance. 4. Slowly lift your other foot, allowing your body weight to press your left / right heel down over the edge of the step. You should feel a stretch in your left / right calf. 5. Hold this position for __________ seconds. 6. Return both feet to the step. 7. Repeat this exercise with a slight bend in your left / right knee. Repeat __________ times. Complete this exercise __________ times a day. Strengthening exercise This exercise builds strength and endurance in your foot muscles and may help to take pressure off your heel. Endurance is the ability to use your muscles for a long time, even after they get tired. Arch lifts This exercise is sometimes called foot intrinsics. This is an exercise in which you lift the arch part of your foot only. To do this exercise: 1. Sit in a chair with your feet flat on the floor. 2. Keeping your big toe and your heel on the floor, lift only your arch,  which is on the inner edge of your left / right foot. Do not move your knee or scrunch your toes. This is a small movement. 3. Hold this position for __________ seconds. 4. Return to the starting position. Repeat __________ times. Complete this exercise __________ times a day. This information is not intended to replace advice given to you by your health care provider. Make sure you discuss any questions you have with your health care provider. Document  Revised: 03/26/2019 Document Reviewed: 09/16/2018 Elsevier Patient Education  2020 [employer]  Pelvic Organ Prolapse Pelvic organ prolapse is the stretching, bulging, or dropping of pelvic organs into an abnormal position. It happens when the muscles and tissues that surround and support pelvic structures become weak or stretched. Pelvic organ prolapse can involve the:  Vagina (vaginal prolapse).  Uterus (uterine prolapse).  Bladder (cystocele).  Rectum (rectocele).  Intestines (enterocele). When organs other than the vagina are involved, they often bulge into the vagina or protrude from the vagina, depending on how severe the prolapse is. What are the causes? This condition may be caused by:  Pregnancy, labor, and childbirth.  Past pelvic surgery.  Decreased production of the hormone estrogen associated with menopause.  Consistently lifting more than 50 lb (23 kg).  Obesity.  Long-term inability to pass stool (chronic constipation).  A cough that lasts a long time (chronic).  Buildup of fluid in the abdomen due to certain diseases and other conditions. What are the signs or symptoms? Symptoms of this condition include:  Passing a little urine (loss of bladder control) when you cough, sneeze, strain, and exercise (stress incontinence). This may be worse immediately after childbirth. It may gradually improve over time.  Feeling pressure in your pelvis or vagina. This pressure may increase when you cough or when you are  passing stool.  A bulge that protrudes from the opening of your vagina.  Difficulty passing urine or stool.  Pain in your lower back.  Pain, discomfort, or disinterest in sex.  Repeated bladder infections (urinary tract infections).  Difficulty inserting a tampon. In some people, this condition causes no symptoms. How is this diagnosed? This condition may be diagnosed based on a vaginal and rectal exam. During the exam, you may be asked to cough and strain while you are lying down, sitting, and standing up. Your health care provider will determine if other tests are required, such as bladder function tests. How is this treated? Treatment for this condition may depend on your symptoms. Treatment may include:  Lifestyle changes, such as changes to your diet.  Emptying your bladder at scheduled times (bladder training therapy). This can help reduce or avoid urinary incontinence.  Estrogen. Estrogen may help mild prolapse by increasing the strength and tone of pelvic floor muscles.  Kegel exercises. These may help mild cases of prolapse by strengthening and tightening the muscles of the pelvic floor.  A soft, flexible device that helps support the vaginal walls and keep pelvic organs in place (pessary). This is inserted into your vagina by your health care provider.  Surgery. This is often the only form of treatment for severe prolapse. Follow these instructions at home:  Avoid drinking beverages that contain caffeine or alcohol.  Increase your intake of high-fiber foods. This can help decrease constipation and straining during bowel movements.  Lose weight if recommended by your health care provider.  Wear a sanitary pad or adult diapers if you have urinary incontinence.  Avoid heavy lifting and straining with exercise and work. Do not hold your breath when you perform mild to moderate lifting and exercise activities. Limit your activities as directed by your health care  provider.  Do Kegel exercises as directed by your health care provider. To do this: ? Squeeze your pelvic floor muscles tight. You should feel a tight lift in your rectal area and a tightness in your vaginal area. Keep your stomach, buttocks, and legs relaxed. ? Hold the muscles tight for up to  10 seconds. ? Relax your muscles. ? Repeat this exercise 50 times a day, or as many times as told by your health care provider. Continue to do this exercise for at least 4-6 weeks, or for as long as told by your health care provider.  Take over-the-counter and prescription medicines only as told by your health care provider.  If you have a pessary, take care of it as told by your health care provider.  Keep all follow-up visits as told by your health care provider. This is important. Contact a health care provider if you:  Have symptoms that interfere with your daily activities or sex life.  Need medicine to help with the discomfort.  Notice bleeding from your vagina that is not related to your period.  Have a fever.  Have pain or bleeding when you urinate.  Have bleeding when you pass stool.  Pass urine when you have sex.  Have chronic constipation.  Have a pessary that falls out.  Have bad smelling vaginal discharge.  Have an unusual, low pain in your abdomen. Summary  Pelvic organ prolapse is the stretching, bulging, or dropping of pelvic organs into an abnormal position. It happens when the muscles and tissues that surround and support pelvic structures become weak or stretched.  When organs other than the vagina are involved, they often bulge into the vagina or protrude from the vagina, depending on how severe the prolapse is.  In most cases, this condition needs to be treated only if it produces symptoms. Treatment may include lifestyle changes, estrogen, Kegel exercises, pessary insertion, or surgery.  Avoid heavy lifting and straining with exercise and work. Do not hold your  breath when you perform mild to moderate lifting and exercise activities. Limit your activities as directed by your health care provider. This information is not intended to replace advice given to you by your health care provider. Make sure you discuss any questions you have with your health care provider. Document Revised: 12/25/2017 Document Reviewed: 12/25/2017 Else[employer] Patient Education  2020 [employer].

## 2020-02-23 ENCOUNTER — Other Ambulatory Visit: Payer: Self-pay

## 2020-02-24 ENCOUNTER — Encounter: Payer: Self-pay | Admitting: Family Medicine

## 2020-02-24 ENCOUNTER — Ambulatory Visit (INDEPENDENT_AMBULATORY_CARE_PROVIDER_SITE_OTHER): Payer: BC Managed Care – PPO | Admitting: Family Medicine

## 2020-02-24 VITALS — BP 118/78 | HR 78 | Temp 97.8°F | Wt 202.0 lb

## 2020-02-24 DIAGNOSIS — R635 Abnormal weight gain: Secondary | ICD-10-CM | POA: Diagnosis not present

## 2020-02-24 DIAGNOSIS — Z90710 Acquired absence of both cervix and uterus: Secondary | ICD-10-CM | POA: Diagnosis not present

## 2020-02-24 DIAGNOSIS — Z0000 Encounter for general adult medical examination without abnormal findings: Secondary | ICD-10-CM

## 2020-02-24 DIAGNOSIS — Z13220 Encounter for screening for lipoid disorders: Secondary | ICD-10-CM | POA: Diagnosis not present

## 2020-02-24 LAB — LIPID PANEL
Cholesterol: 158 mg/dL (ref 0–200)
HDL: 69.6 mg/dL (ref >39.00)
LDL Cholesterol: 76 mg/dL (ref 0–99)
NonHDL: 88.6
Total CHOL/HDL Ratio: 2
Triglycerides: 61 mg/dL (ref 0.0–149.0)
VLDL: 12.2 mg/dL (ref 0.0–40.0)

## 2020-02-24 LAB — CBC WITH DIFFERENTIAL/PLATELET
Basophils Absolute: 0 10*3/uL (ref 0.0–0.1)
Basophils Relative: 0.8 % (ref 0.0–3.0)
Eosinophils Absolute: 0.2 10*3/uL (ref 0.0–0.7)
Eosinophils Relative: 3.4 % (ref 0.0–5.0)
HCT: 37.7 % (ref 36.0–46.0)
Hemoglobin: 12.2 g/dL (ref 12.0–15.0)
Lymphocytes Relative: 41.4 % (ref 12.0–46.0)
Lymphs Abs: 2.4 10*3/uL (ref 0.7–4.0)
MCHC: 32.4 g/dL (ref 30.0–36.0)
MCV: 90.3 fl (ref 78.0–100.0)
Monocytes Absolute: 0.3 10*3/uL (ref 0.1–1.0)
Monocytes Relative: 5.1 % (ref 3.0–12.0)
Neutro Abs: 2.8 10*3/uL (ref 1.4–7.7)
Neutrophils Relative %: 49.3 % (ref 43.0–77.0)
Platelets: 155 10*3/uL (ref 150.0–400.0)
RBC: 4.18 Mil/uL (ref 3.87–5.11)
RDW: 15.1 % (ref 11.5–15.5)
WBC: 5.8 10*3/uL (ref 4.0–10.5)

## 2020-02-24 LAB — TSH: TSH: 1.74 u[IU]/mL (ref 0.35–4.50)

## 2020-02-24 LAB — BASIC METABOLIC PANEL
BUN: 15 mg/dL (ref 6–23)
CO2: 29 mEq/L (ref 19–32)
Calcium: 9.4 mg/dL (ref 8.4–10.5)
Chloride: 105 mEq/L (ref 96–112)
Creatinine, Ser: 0.75 mg/dL (ref 0.40–1.20)
GFR: 97.51 mL/min (ref >60.00)
Glucose, Bld: 105 mg/dL — ABNORMAL HIGH (ref 70–99)
Potassium: 4 mEq/L (ref 3.5–5.1)
Sodium: 137 mEq/L (ref 135–145)

## 2020-02-24 LAB — HEMOGLOBIN A1C: Hgb A1c MFr Bld: 6.2 % (ref 4.6–6.5)

## 2020-02-24 LAB — T4, FREE: Free T4: 0.85 ng/dL (ref 0.60–1.60)

## 2020-02-24 NOTE — Patient Instructions (Signed)
Preventive Care 37-54 Years Old, Female Preventive care refers to visits with your health care provider and lifestyle choices that can promote health and wellness. This includes:  A yearly physical exam. This may also be called an annual well check.  Regular dental visits and eye exams.  Immunizations.  Screening for certain conditions.  Healthy lifestyle choices, such as eating a healthy diet, getting regular exercise, not using drugs or products that contain nicotine and tobacco, and limiting alcohol use. What can I expect for my preventive care visit? Physical exam Your health care provider will check your:  Height and weight. This may be used to calculate body mass index (BMI), which tells if you are at a healthy weight.  Heart rate and blood pressure.  Skin for abnormal spots. Counseling Your health care provider may ask you questions about your:  Alcohol, tobacco, and drug use.  Emotional well-being.  Home and relationship well-being.  Sexual activity.  Eating habits.  Work and work Statistician.  Method of birth control.  Menstrual cycle.  Pregnancy history. What immunizations do I need?  Influenza (flu) vaccine  This is recommended every year. Tetanus, diphtheria, and pertussis (Tdap) vaccine  You may need a Td booster every 10 years. Varicella (chickenpox) vaccine  You may need this if you have not been vaccinated. Zoster (shingles) vaccine  You may need this after age 98. Measles, mumps, and rubella (MMR) vaccine  You may need at least one dose of MMR if you were born in 1957 or later. You may also need a second dose. Pneumococcal conjugate (PCV13) vaccine  You may need this if you have certain conditions and were not previously vaccinated. Pneumococcal polysaccharide (PPSV23) vaccine  You may need one or two doses if you smoke cigarettes or if you have certain conditions. Meningococcal conjugate (MenACWY) vaccine  You may need this if you  have certain conditions. Hepatitis A vaccine  You may need this if you have certain conditions or if you travel or work in places where you may be exposed to hepatitis A. Hepatitis B vaccine  You may need this if you have certain conditions or if you travel or work in places where you may be exposed to hepatitis B. Haemophilus influenzae type b (Hib) vaccine  You may need this if you have certain conditions. Human papillomavirus (HPV) vaccine  If recommended by your health care provider, you may need three doses over 6 months. You may receive vaccines as individual doses or as more than one vaccine together in one shot (combination vaccines). Talk with your health care provider about the risks and benefits of combination vaccines. What tests do I need? Blood tests  Lipid and cholesterol levels. These may be checked every 5 years, or more frequently if you are over 83 years old.  Hepatitis C test.  Hepatitis B test. Screening  Lung cancer screening. You may have this screening every year starting at age 11 if you have a 30-pack-year history of smoking and currently smoke or have quit within the past 15 years.  Colorectal cancer screening. All adults should have this screening starting at age 58 and continuing until age 23. Your health care provider may recommend screening at age 28 if you are at increased risk. You will have tests every 1-10 years, depending on your results and the type of screening test.  Diabetes screening. This is done by checking your blood sugar (glucose) after you have not eaten for a while (fasting). You may have this  done every 1-3 years.  Mammogram. This may be done every 1-2 years. Talk with your health care provider about when you should start having regular mammograms. This may depend on whether you have a family history of breast cancer.  BRCA-related cancer screening. This may be done if you have a family history of breast, ovarian, tubal, or peritoneal  cancers.  Pelvic exam and Pap test. This may be done every 3 years starting at age 97. Starting at age 58, this may be done every 5 years if you have a Pap test in combination with an HPV test. Other tests  Sexually transmitted disease (STD) testing.  Bone density scan. This is done to screen for osteoporosis. You may have this scan if you are at high risk for osteoporosis. Follow these instructions at home: Eating and drinking  Eat a diet that includes fresh fruits and vegetables, whole grains, lean protein, and low-fat dairy.  Take vitamin and mineral supplements as recommended by your health care provider.  Do not drink alcohol if: ? Your health care provider tells you not to drink. ? You are pregnant, may be pregnant, or are planning to become pregnant.  If you drink alcohol: ? Limit how much you have to 0-1 drink a day. ? Be aware of how much alcohol is in your drink. In the [place] one drink equals one 12 oz bottle of beer (355 mL), one 5 oz glass of wine (148 mL), or one 1 oz glass of hard liquor (44 mL). Lifestyle  Take daily care of your teeth and gums.  Stay active. Exercise for at least 30 minutes on 5 or more days each week.  Do not use any products that contain nicotine or tobacco, such as cigarettes, e-cigarettes, and chewing tobacco. If you need help quitting, ask your health care provider.  If you are sexually active, practice safe sex. Use a condom or other form of birth control (contraception) in order to prevent pregnancy and STIs (sexually transmitted infections).  If told by your health care provider, take low-dose aspirin daily starting at age 32. What's next?  Visit your health care provider once a year for a well check visit.  Ask your health care provider how often you should have your eyes and teeth checked.  Stay up to date on all vaccines. This information is not intended to replace advice given to you by your health care provider. Make sure you  discuss any questions you have with your health care provider. Document Revised: 08/14/2018 Document Reviewed: 08/14/2018 Elsevier Patient Education  2020 [place] [employer] Calories are units of energy. Your body needs a certain amount of calories from food to keep you going throughout the day. When you eat more calories than your body needs, your body stores the extra calories as fat. When you eat fewer calories than your body needs, your body burns fat to get the energy it needs. Calorie counting means keeping track of how many calories you eat and drink each day. Calorie counting can be helpful if you need to lose weight. If you make sure to eat fewer calories than your body needs, you should lose weight. Ask your health care provider what a healthy weight is for you. For calorie counting to work, you will need to eat the right number of calories in a day in order to lose a healthy amount of weight per week. A dietitian can help you determine how many calories you need  in a day and will give you suggestions on how to reach your calorie goal.  A healthy amount of weight to lose per week is usually 1-2 lb (0.5-0.9 kg). This usually means that your daily calorie intake should be reduced by 500-750 calories.  Eating 1,200 - 1,500 calories per day can help most women lose weight.  Eating 1,500 - 1,800 calories per day can help most men lose weight. What is my plan? My goal is to have __________ calories per day. If I have this many calories per day, I should lose around __________ pounds per week. What do I need to know about calorie counting? In order to meet your daily calorie goal, you will need to:  Find out how many calories are in each food you would like to eat. Try to do this before you eat.  Decide how much of the food you plan to eat.  Write down what you ate and how many calories it had. Doing this is called keeping a food log. To successfully lose  weight, it is important to balance calorie counting with a healthy lifestyle that includes regular activity. Aim for 150 minutes of moderate exercise (such as walking) or 75 minutes of vigorous exercise (such as running) each week. Where do I find calorie information?  The number of calories in a food can be found on a Nutrition Facts label. If a food does not have a Nutrition Facts label, try to look up the calories online or ask your dietitian for help. Remember that calories are listed per serving. If you choose to have more than one serving of a food, you will have to multiply the calories per serving by the amount of servings you plan to eat. For example, the label on a package of bread might say that a serving size is 1 slice and that there are 90 calories in a serving. If you eat 1 slice, you will have eaten 90 calories. If you eat 2 slices, you will have eaten 180 calories. How do I keep a food log? Immediately after each meal, record the following information in your food log:  What you ate. Don't forget to include toppings, sauces, and other extras on the food.  How much you ate. This can be measured in cups, ounces, or number of items.  How many calories each food and drink had.  The total number of calories in the meal. Keep your food log near you, such as in a small notebook in your pocket, or use a mobile app or website. Some programs will calculate calories for you and show you how many calories you have left for the day to meet your goal. What are some calorie counting tips?   Use your calories on foods and drinks that will fill you up and not leave you hungry: ? Some examples of foods that fill you up are nuts and nut butters, vegetables, lean proteins, and high-fiber foods like whole grains. High-fiber foods are foods with more than 5 g fiber per serving. ? Drinks such as sodas, specialty coffee drinks, alcohol, and juices have a lot of calories, yet do not fill you up.  Eat  nutritious foods and avoid empty calories. Empty calories are calories you get from foods or beverages that do not have many vitamins or protein, such as candy, sweets, and soda. It is better to have a nutritious high-calorie food (such as an avocado) than a food with few nutrients (such as a bag  of chips).  Know how many calories are in the foods you eat most often. This will help you calculate calorie counts faster.  Pay attention to calories in drinks. Low-calorie drinks include water and unsweetened drinks.  Pay attention to nutrition labels for "low fat" or "fat free" foods. These foods sometimes have the same amount of calories or more calories than the full fat versions. They also often have added sugar, starch, or salt, to make up for flavor that was removed with the fat.  Find a way of tracking calories that works for you. Get creative. Try different apps or programs if writing down calories does not work for you. What are some portion control tips?  Know how many calories are in a serving. This will help you know how many servings of a certain food you can have.  Use a measuring cup to measure serving sizes. You could also try weighing out portions on a kitchen scale. With time, you will be able to estimate serving sizes for some foods.  Take some time to put servings of different foods on your favorite plates, bowls, and cups so you know what a serving looks like.  Try not to eat straight from a bag or box. Doing this can lead to overeating. Put the amount you would like to eat in a cup or on a plate to make sure you are eating the right portion.  Use smaller plates, glasses, and bowls to prevent overeating.  Try not to multitask (for example, watch TV or use your computer) while eating. If it is time to eat, sit down at a table and enjoy your food. This will help you to know when you are full. It will also help you to be aware of what you are eating and how much you are eating. What  are tips for following this plan? Reading food labels  Check the calorie count compared to the serving size. The serving size may be smaller than what you are used to eating.  Check the source of the calories. Make sure the food you are eating is high in vitamins and protein and low in saturated and trans fats. Shopping  Read nutrition labels while you shop. This will help you make healthy decisions before you decide to purchase your food.  Make a grocery list and stick to it. Cooking  Try to cook your favorite foods in a healthier way. For example, try baking instead of frying.  Use low-fat dairy products. Meal planning  Use more fruits and vegetables. Half of your plate should be fruits and vegetables.  Include lean proteins like poultry and fish. How do I count calories when eating out?  Ask for smaller portion sizes.  Consider sharing an entree and sides instead of getting your own entree.  If you get your own entree, eat only half. Ask for a box at the beginning of your meal and put the rest of your entree in it so you are not tempted to eat it.  If calories are listed on the menu, choose the lower calorie options.  Choose dishes that include vegetables, fruits, whole grains, low-fat dairy products, and lean protein.  Choose items that are boiled, broiled, grilled, or steamed. Stay away from items that are buttered, battered, fried, or served with cream sauce. Items labeled "crispy" are usually fried, unless stated otherwise.  Choose water, low-fat milk, unsweetened iced tea, or other drinks without added sugar. If you want an alcoholic beverage, choose a  lower calorie option such as a glass of wine or light beer.  Ask for dressings, sauces, and syrups on the side. These are usually high in calories, so you should limit the amount you eat.  If you want a salad, choose a garden salad and ask for grilled meats. Avoid extra toppings like bacon, cheese, or fried items. Ask for  the dressing on the side, or ask for olive oil and vinegar or lemon to use as dressing.  Estimate how many servings of a food you are given. For example, a serving of cooked rice is  cup or about the size of half a baseball. Knowing serving sizes will help you be aware of how much food you are eating at restaurants. The list below tells you how big or small some common portion sizes are based on everyday objects: ? 1 oz--4 stacked dice. ? 3 oz--1 deck of cards. ? 1 tsp--1 die. ? 1 Tbsp-- a ping-pong ball. ? 2 Tbsp--1 ping-pong ball. ?  cup-- baseball. ? 1 cup--1 baseball. Summary  Calorie counting means keeping track of how many calories you eat and drink each day. If you eat fewer calories than your body needs, you should lose weight.  A healthy amount of weight to lose per week is usually 1-2 lb (0.5-0.9 kg). This usually means reducing your daily calorie intake by 500-750 calories.  The number of calories in a food can be found on a Nutrition Facts label. If a food does not have a Nutrition Facts label, try to look up the calories online or ask your dietitian for help.  Use your calories on foods and drinks that will fill you up, and not on foods and drinks that will leave you hungry.  Use smaller plates, glasses, and bowls to prevent overeating. This information is not intended to replace advice given to you by your health care provider. Make sure you discuss any questions you have with your health care provider. Document Revised: 08/22/2018 Document Reviewed: 11/02/2016 Elsevier Patient Education  2020 [employer].  Exercising to Lose Weight Exercise is structured, repetitive physical activity to improve fitness and health. Getting regular exercise is important for everyone. It is especially important if you are overweight. Being overweight increases your risk of heart disease, stroke, diabetes, high blood pressure, and several types of cancer. Reducing your calorie intake and  exercising can help you lose weight. Exercise is usually categorized as moderate or vigorous intensity. To lose weight, most people need to do a certain amount of moderate-intensity or vigorous-intensity exercise each week. Moderate-intensity exercise  Moderate-intensity exercise is any activity that gets you moving enough to burn at least three times more energy (calories) than if you were sitting. Examples of moderate exercise include:  Walking a mile in 15 minutes.  Doing light yard work.  Biking at an easy pace. Most people should get at least 150 minutes (2 hours and 30 minutes) a week of moderate-intensity exercise to maintain their body weight. Vigorous-intensity exercise Vigorous-intensity exercise is any activity that gets you moving enough to burn at least six times more calories than if you were sitting. When you exercise at this intensity, you should be working hard enough that you are not able to carry on a conversation. Examples of vigorous exercise include:  Running.  Playing a team sport, such as football, basketball, and soccer.  Jumping rope. Most people should get at least 75 minutes (1 hour and 15 minutes) a week of vigorous-intensity exercise to maintain  their body weight. How can exercise affect me? When you exercise enough to burn more calories than you eat, you lose weight. Exercise also reduces body fat and builds muscle. The more muscle you have, the more calories you burn. Exercise also:  Improves mood.  Reduces stress and tension.  Improves your overall fitness, flexibility, and endurance.  Increases bone strength. The amount of exercise you need to lose weight depends on:  Your age.  The type of exercise.  Any health conditions you have.  Your overall physical ability. Talk to your health care provider about how much exercise you need and what types of activities are safe for you. What actions can I take to lose weight? Nutrition   Make  changes to your diet as told by your health care provider or diet and nutrition specialist (dietitian). This may include: ? Eating fewer calories. ? Eating more protein. ? Eating less unhealthy fats. ? Eating a diet that includes fresh fruits and vegetables, whole grains, low-fat dairy products, and lean protein. ? Avoiding foods with added fat, salt, and sugar.  Drink plenty of water while you exercise to prevent dehydration or heat stroke. Activity  Choose an activity that you enjoy and set realistic goals. Your health care provider can help you make an exercise plan that works for you.  Exercise at a moderate or vigorous intensity most days of the week. ? The intensity of exercise may vary from person to person. You can tell how intense a workout is for you by paying attention to your breathing and heartbeat. Most people will notice their breathing and heartbeat get faster with more intense exercise.  Do resistance training twice each week, such as: ? Push-ups. ? Sit-ups. ? Lifting weights. ? Using resistance bands.  Getting short amounts of exercise can be just as helpful as long structured periods of exercise. If you have trouble finding time to exercise, try to include exercise in your daily routine. ? Get up, stretch, and walk around every 30 minutes throughout the day. ? Go for a walk during your lunch break. ? Park your car farther away from your destination. ? If you take public transportation, get off one stop early and walk the rest of the way. ? Make phone calls while standing up and walking around. ? Take the stairs instead of elevators or escalators.  Wear comfortable clothes and shoes with good support.  Do not exercise so much that you hurt yourself, feel dizzy, or get very short of breath. Where to find more information  [place] Department of Health and Human Services: [employer]  Centers for Disease Control and Prevention (CDC): http://www.wolf.info/ Contact a health care  provider:  Before starting a new exercise program.  If you have questions or concerns about your weight.  If you have a medical problem that keeps you from exercising. Get help right away if you have any of the following while exercising:  Injury.  Dizziness.  Difficulty breathing or shortness of breath that does not go away when you stop exercising.  Chest pain.  Rapid heartbeat. Summary  Being overweight increases your risk of heart disease, stroke, diabetes, high blood pressure, and several types of cancer.  Losing weight happens when you burn more calories than you eat.  Reducing the amount of calories you eat in addition to getting regular moderate or vigorous exercise each week helps you lose weight. This information is not intended to replace advice given to you by your health care provider. Make  sure you discuss any questions you have with your health care provider. Document Revised: 12/16/2017 Document Reviewed: 12/16/2017 Elsevier Patient Education  2020 [employer].

## 2020-02-24 NOTE — Progress Notes (Signed)
. Subjective:     [name] is a 54 y.o. female and is here for a comprehensive physical exam. The patient reports no problems.  Pt notes gaining weight.  Initially set a goal of losing 50 lbs by her 41 th birthday, but states she is still determined to do it.  Pt notes cooking more at home but being surrounded by junk food 2/2 her kids.    Colonoscopy up to date.  Mammogram up to date- done Oct/Nov 2020.  Pt needs to schedule a pap/check of vaginal cuff as s/p hysterectomy.  Social History   Socioeconomic History  . Marital status: Married    Spouse name: Not on file  . Number of children: Not on file  . Years of education: Not on file  . Highest education level: Not on file  Occupational History  . Not on file  Tobacco Use  . Smoking status: Former Research scientist (life sciences)  . Smokeless tobacco: Never Used  . Tobacco comment: quit 15 years ago (social smoker)  Substance and Sexual Activity  . Alcohol use: Yes    Comment: ocass.   . Drug use: No  . Sexual activity: Not on file  Other Topics Concern  . Not on file  Social History Narrative  . Not on file   Social Determinants of Health   Financial Resource Strain:   . Difficulty of Paying Living Expenses: Not on file  Food Insecurity:   . Worried About Charity fundraiser in the Last Year: Not on file  . Ran Out of Food in the Last Year: Not on file  Transportation Needs:   . Lack of Transportation (Medical): Not on file  . Lack of Transportation (Non-Medical): Not on file  Physical Activity:   . Days of Exercise per Week: Not on file  . Minutes of Exercise per Session: Not on file  Stress:   . Feeling of Stress : Not on file  Social Connections:   . Frequency of Communication with Friends and Family: Not on file  . Frequency of Social Gatherings with Friends and Family: Not on file  . Attends Religious Services: Not on file  . Active Member of Clubs or Organizations: Not on file  . Attends Archivist  Meetings: Not on file  . Marital Status: Not on file  Intimate Partner Violence:   . Fear of Current or Ex-Partner: Not on file  . Emotionally Abused: Not on file  . Physically Abused: Not on file  . Sexually Abused: Not on file   Health Maintenance  Topic Date Due  . HIV Screening  05/02/1981  . PAP SMEAR-Modifier  04/08/2014  . INFLUENZA VACCINE  07/18/2019  . MAMMOGRAM  09/21/2021  . COLONOSCOPY  10/15/2029  . TETANUS/TDAP  12/30/2029    The following portions of the patient's history were reviewed and updated as appropriate: allergies, current medications, past family history, past medical history, past social history, past surgical history and problem list.  Review of Systems Pertinent items noted in HPI and remainder of comprehensive ROS otherwise negative.   Objective:    BP 118/78 (BP Location: Left Arm, Patient Position: Sitting, Cuff Size: Large)   Pulse 78   Temp 97.8 F (36.6 C) (Temporal)   Wt 202 lb (91.6 kg)   SpO2 98%   BMI 34.14 kg/m  General appearance: alert, cooperative and no distress Head: Normocephalic, without obvious abnormality, atraumatic Eyes: conjunctivae/corneas clear. PERRL, EOM's intact. Fundi benign. Ears: normal TM's and external  ear canals both ears Nose: Nares normal. Septum midline. Mucosa normal. No drainage or sinus tenderness. Throat: lips, mucosa, and tongue normal; teeth and gums normal Neck: no adenopathy, no carotid bruit, no JVD, supple, symmetrical, trachea midline and thyroid not enlarged, symmetric, no tenderness/mass/nodules Lungs: clear to auscultation bilaterally Heart: regular rate and rhythm, S1, S2 normal, no murmur, click, rub or gallop Abdomen: soft, non-tender; bowel sounds normal; no masses,  no organomegaly Extremities: extremities normal, atraumatic, no cyanosis or edema Pulses: 2+ and symmetric Skin: Skin color, texture, turgor normal. No rashes or lesions Lymph nodes: Cervical, supraclavicular, and axillary  nodes normal. Neurologic: Alert and oriented X 3, normal strength and tone. Normal symmetric reflexes. Normal coordination and gait    Assessment:    Healthy female exam.     Plan:     Anticipatory guidance given including wearing seatbelts, smoke detectors in the home, increasing physical activity, increasing p.o. intake of water and vegetables. -will obtain labs -mammogram up to date -pap to be done with OB/Gyn -next CPE in 1 yr -Given handout See After Visit Summary for Counseling Recommendations    Weight gain  -continue exercise and portion control -given hanout - Plan: TSH, T4, Free, Hemoglobin A1c, Lipid panel  Screening for cholesterol level  - Plan: Lipid panel  S/p hysterectomy -discussed need to check vaginal cuff -Plan: referral to OB/Gyn  F/u prn  Abbe Amsterdam, MD

## 2021-03-01 ENCOUNTER — Encounter: Payer: Self-pay | Admitting: Family Medicine

## 2021-03-01 ENCOUNTER — Other Ambulatory Visit: Payer: Self-pay

## 2021-03-01 ENCOUNTER — Ambulatory Visit (INDEPENDENT_AMBULATORY_CARE_PROVIDER_SITE_OTHER): Payer: BC Managed Care – PPO | Admitting: Family Medicine

## 2021-03-01 VITALS — BP 130/82 | HR 75 | Temp 98.0°F | Ht 65.5 in | Wt 197.0 lb

## 2021-03-01 DIAGNOSIS — R7303 Prediabetes: Secondary | ICD-10-CM

## 2021-03-01 DIAGNOSIS — R635 Abnormal weight gain: Secondary | ICD-10-CM | POA: Diagnosis not present

## 2021-03-01 DIAGNOSIS — Z0000 Encounter for general adult medical examination without abnormal findings: Secondary | ICD-10-CM

## 2021-03-01 DIAGNOSIS — R55 Syncope and collapse: Secondary | ICD-10-CM | POA: Diagnosis not present

## 2021-03-01 LAB — LIPID PANEL
Cholesterol: 164 mg/dL (ref 0–200)
HDL: 64.2 mg/dL (ref >39.00)
LDL Cholesterol: 88 mg/dL (ref 0–99)
NonHDL: 99.98
Total CHOL/HDL Ratio: 3
Triglycerides: 61 mg/dL (ref 0.0–149.0)
VLDL: 12.2 mg/dL (ref 0.0–40.0)

## 2021-03-01 LAB — CBC WITH DIFFERENTIAL/PLATELET
Basophils Absolute: 0.1 10*3/uL (ref 0.0–0.1)
Basophils Relative: 0.9 % (ref 0.0–3.0)
Eosinophils Absolute: 0.2 10*3/uL (ref 0.0–0.7)
Eosinophils Relative: 3.3 % (ref 0.0–5.0)
HCT: 37.7 % (ref 36.0–46.0)
Hemoglobin: 12.6 g/dL (ref 12.0–15.0)
Lymphocytes Relative: 41.2 % (ref 12.0–46.0)
Lymphs Abs: 2.3 10*3/uL (ref 0.7–4.0)
MCHC: 33.4 g/dL (ref 30.0–36.0)
MCV: 89 fl (ref 78.0–100.0)
Monocytes Absolute: 0.3 10*3/uL (ref 0.1–1.0)
Monocytes Relative: 4.6 % (ref 3.0–12.0)
Neutro Abs: 2.8 10*3/uL (ref 1.4–7.7)
Neutrophils Relative %: 50 % (ref 43.0–77.0)
Platelets: 164 10*3/uL (ref 150.0–400.0)
RBC: 4.24 Mil/uL (ref 3.87–5.11)
RDW: 15.1 % (ref 11.5–15.5)
WBC: 5.6 10*3/uL (ref 4.0–10.5)

## 2021-03-01 LAB — COMPREHENSIVE METABOLIC PANEL
ALT: 14 U/L (ref 0–35)
AST: 16 U/L (ref 0–37)
Albumin: 4 g/dL (ref 3.5–5.2)
Alkaline Phosphatase: 63 U/L (ref 39–117)
BUN: 15 mg/dL (ref 6–23)
CO2: 27 mEq/L (ref 19–32)
Calcium: 9.3 mg/dL (ref 8.4–10.5)
Chloride: 106 mEq/L (ref 96–112)
Creatinine, Ser: 0.77 mg/dL (ref 0.40–1.20)
GFR: 87.2 mL/min (ref >60.00)
Glucose, Bld: 92 mg/dL (ref 70–99)
Potassium: 3.9 mEq/L (ref 3.5–5.1)
Sodium: 139 mEq/L (ref 135–145)
Total Bilirubin: 0.5 mg/dL (ref 0.2–1.2)
Total Protein: 7.2 g/dL (ref 6.0–8.3)

## 2021-03-01 LAB — TSH: TSH: 1.62 u[IU]/mL (ref 0.35–4.50)

## 2021-03-01 LAB — VITAMIN B12: Vitamin B-12: 501 pg/mL (ref 211–911)

## 2021-03-01 LAB — HEMOGLOBIN A1C: Hgb A1c MFr Bld: 6.1 % (ref 4.6–6.5)

## 2021-03-01 LAB — VITAMIN D 25 HYDROXY (VIT D DEFICIENCY, FRACTURES): VITD: 37.65 ng/mL (ref 30.00–100.00)

## 2021-03-01 LAB — T4, FREE: Free T4: 0.84 ng/dL (ref 0.60–1.60)

## 2021-03-01 NOTE — Patient Instructions (Signed)
Preventive Care 55-55 Years Old, Female Preventive care refers to lifestyle choices and visits with your health care provider that can promote health and wellness. This includes:  A yearly physical exam. This is also called an annual wellness visit.  Regular dental and eye exams.  Immunizations.  Screening for certain conditions.  Healthy lifestyle choices, such as: ? Eating a healthy diet. ? Getting regular exercise. ? Not using drugs or products that contain nicotine and tobacco. ? Limiting alcohol use. What can I expect for my preventive care visit? Physical exam Your health care provider will check your:  Height and weight. These may be used to calculate your BMI (body mass index). BMI is a measurement that tells if you are at a healthy weight.  Heart rate and blood pressure.  Body temperature.  Skin for abnormal spots. Counseling Your health care provider may ask you questions about your:  Past medical problems.  Family's medical history.  Alcohol, tobacco, and drug use.  Emotional well-being.  Home life and relationship well-being.  Sexual activity.  Diet, exercise, and sleep habits.  Work and work Statistician.  Access to firearms.  Method of birth control.  Menstrual cycle.  Pregnancy history. What immunizations do I need? Vaccines are usually given at various ages, according to a schedule. Your health care provider will recommend vaccines for you based on your age, medical history, and lifestyle or other factors, such as travel or where you work.   What tests do I need? Blood tests  Lipid and cholesterol levels. These may be checked every 5 years, or more often if you are over 3 years old.  Hepatitis C test.  Hepatitis B test. Screening  Lung cancer screening. You may have this screening every year starting at age 73 if you have a 30-pack-year history of smoking and currently smoke or have quit within the past 15 years.  Colorectal cancer  screening. ? All adults should have this screening starting at age 52 and continuing until age 17. ? Your health care provider may recommend screening at age 49 if you are at increased risk. ? You will have tests every 1-10 years, depending on your results and the type of screening test.  Diabetes screening. ? This is done by checking your blood sugar (glucose) after you have not eaten for a while (fasting). ? You may have this done every 1-3 years.  Mammogram. ? This may be done every 1-2 years. ? Talk with your health care provider about when you should start having regular mammograms. This may depend on whether you have a family history of breast cancer.  BRCA-related cancer screening. This may be done if you have a family history of breast, ovarian, tubal, or peritoneal cancers.  Pelvic exam and Pap test. ? This may be done every 3 years starting at age 10. ? Starting at age 11, this may be done every 5 years if you have a Pap test in combination with an HPV test. Other tests  STD (sexually transmitted disease) testing, if you are at risk.  Bone density scan. This is done to screen for osteoporosis. You may have this scan if you are at high risk for osteoporosis. Talk with your health care provider about your test results, treatment options, and if necessary, the need for more tests. Follow these instructions at home: Eating and drinking  Eat a diet that includes fresh fruits and vegetables, whole grains, lean protein, and low-fat dairy products.  Take vitamin and mineral supplements  as recommended by your health care provider.  Do not drink alcohol if: ? Your health care provider tells you not to drink. ? You are pregnant, may be pregnant, or are planning to become pregnant.  If you drink alcohol: ? Limit how much you have to 0-1 drink a day. ? Be aware of how much alcohol is in your drink. In the [place] one drink equals one 12 oz bottle of beer (355 mL), one 5 oz glass of  wine (148 mL), or one 1 oz glass of hard liquor (44 mL).   Lifestyle  Take daily care of your teeth and gums. Brush your teeth every morning and night with fluoride toothpaste. Floss one time each day.  Stay active. Exercise for at least 30 minutes 5 or more days each week.  Do not use any products that contain nicotine or tobacco, such as cigarettes, e-cigarettes, and chewing tobacco. If you need help quitting, ask your health care provider.  Do not use drugs.  If you are sexually active, practice safe sex. Use a condom or other form of protection to prevent STIs (sexually transmitted infections).  If you do not wish to become pregnant, use a form of birth control. If you plan to become pregnant, see your health care provider for a prepregnancy visit.  If told by your health care provider, take low-dose aspirin daily starting at age 75.  Find healthy ways to cope with stress, such as: ? Meditation, yoga, or listening to music. ? Journaling. ? Talking to a trusted person. ? Spending time with friends and family. Safety  Always wear your seat belt while driving or riding in a vehicle.  Do not drive: ? If you have been drinking alcohol. Do not ride with someone who has been drinking. ? When you are tired or distracted. ? While texting.  Wear a helmet and other protective equipment during sports activities.  If you have firearms in your house, make sure you follow all gun safety procedures. What's next?  Visit your health care provider once a year for an annual wellness visit.  Ask your health care provider how often you should have your eyes and teeth checked.  Stay up to date on all vaccines. This information is not intended to replace advice given to you by your health care provider. Make sure you discuss any questions you have with your health care provider. Document Revised: 09/06/2020 Document Reviewed: 08/14/2018 Elsevier Patient Education  2021 [place], 44(Suppl 1), [phone]. https://doi.org/https://doi.org/10.2337/dc21-S003">  Prediabetes Prediabetes is when your blood sugar (blood glucose) level is higher than normal but not high enough for you to be diagnosed with type 2 diabetes. Having prediabetes puts you at risk for developing type 2 diabetes (type 2 diabetes mellitus). With certain lifestyle changes, you may be able to prevent or delay the onset of type 2 diabetes. This is important because type 2 diabetes can lead to serious complications, such as:  Heart disease.  Stroke.  Blindness.  Kidney disease.  Depression.  Poor circulation in the feet and legs. In severe cases, this could lead to surgical removal of a leg (amputation). What are the causes? The exact cause of prediabetes is not known. It may result from insulin resistance. Insulin resistance develops when cells in the body do not respond properly to insulin that the body makes. This can cause excess glucose to build up in the blood. High blood glucose (hyperglycemia) can develop. What increases the risk? The following  factors may make you more likely to develop this condition:  You have a family member with type 2 diabetes.  You are older than 45 years.  You had a temporary form of diabetes during a pregnancy (gestational diabetes).  You had polycystic ovary syndrome (PCOS).  You are overweight or obese.  You are inactive (sedentary).  You have a history of heart disease, including problems with cholesterol levels, high levels of blood fats, or high blood pressure. What are the signs or symptoms? You may have no symptoms. If you do have symptoms, they may include:  Increased hunger.  Increased thirst.  Increased urination.  Vision changes, such as blurry vision.  Tiredness (fatigue). How is this diagnosed? This condition can be diagnosed with blood tests. Your blood glucose may be checked with one or more of the following  tests:  A fasting blood glucose (FBG) test. You will not be allowed to eat (you will fast) for at least 8 hours before a blood sample is taken.  An A1C blood test (hemoglobin A1C). This test provides information about blood glucose levels over the previous 2?3 months.  An oral glucose tolerance test (OGTT). This test measures your blood glucose at two points in time: ? After fasting. This is your baseline level. ? Two hours after you drink a beverage that contains glucose. You may be diagnosed with prediabetes if:  Your FBG is 100?125 mg/dL (5.6-6.9 mmol/L).  Your A1C level is 5.7?6.4% (39-46 mmol/mol).  Your OGTT result is 140?199 mg/dL (7.8-11 mmol/L). These blood tests may be repeated to confirm your diagnosis.   How is this treated? Treatment may include dietary and lifestyle changes to help lower your blood glucose and prevent type 2 diabetes from developing. In some cases, medicine may be prescribed to help lower the risk of type 2 diabetes. Follow these instructions at home: Nutrition  Follow a healthy meal plan. This includes eating lean proteins, whole grains, legumes, fresh fruits and vegetables, low-fat dairy products, and healthy fats.  Follow instructions from your health care provider about eating or drinking restrictions.  Meet with a dietitian to create a healthy eating plan that is right for you.   Lifestyle  Do moderate-intensity exercise for at least 30 minutes a day on 5 or more days each week, or as told by your health care provider. A mix of activities may be best, such as: ? Brisk walking, swimming, biking, and weight lifting.  Lose weight as told by your health care provider. Losing 5-7% of your body weight can reverse insulin resistance.  Do not drink alcohol if: ? Your health care provider tells you not to drink. ? You are pregnant, may be pregnant, or are planning to become pregnant.  If you drink alcohol: ? Limit how much you use to:  0-1 drink a  day for women.  0-2 drinks a day for men. ? Be aware of how much alcohol is in your drink. In the [place] one drink equals one 12 oz bottle of beer (355 mL), one 5 oz glass of wine (148 mL), or one 1 oz glass of hard liquor (44 mL). General instructions  Take over-the-counter and prescription medicines only as told by your health care provider. You may be prescribed medicines that help lower the risk of type 2 diabetes.  Do not use any products that contain nicotine or tobacco, such as cigarettes, e-cigarettes, and chewing tobacco. If you need help quitting, ask your health care provider.  Keep all follow-up  visits. This is important. Where to find more information  American Diabetes Association: www.diabetes.org  Academy of Nutrition and Dietetics: www.eatright.org  American Heart Association: www.heart.org Contact a health care provider if:  You have any of these symptoms: ? Increased hunger. ? Increased urination. ? Increased thirst. ? Fatigue. ? Vision changes, such as blurry vision. Get help right away if you:  Have shortness of breath.  Feel confused.  Vomit or feel like you may vomit. Summary  Prediabetes is when your blood sugar (blood glucose)level is higher than normal but not high enough for you to be diagnosed with type 2 diabetes.  Having prediabetes puts you at risk for developing type 2 diabetes (type 2 diabetes mellitus).  Make lifestyle changes such as eating a healthy diet and exercising regularly to help prevent diabetes. Lose weight as told by your health care provider. This information is not intended to replace advice given to you by your health care provider. Make sure you discuss any questions you have with your health care provider. Document Revised: 03/03/2020 Document Reviewed: 03/03/2020 Elsevier Patient Education  2021 [place].  Prediabetes Eating Plan Prediabetes is a condition that causes blood sugar (glucose) levels to be higher than  normal. This increases the risk for developing type 2 diabetes (type 2 diabetes mellitus). Working with a health care provider or nutrition specialist (dietitian) to make diet and lifestyle changes can help prevent the onset of diabetes. These changes may help you:  Control your blood glucose levels.  Improve your cholesterol levels.  Manage your blood pressure. What are tips for following this plan? Reading food labels  Read food labels to check the amount of fat, salt (sodium), and sugar in prepackaged foods. Avoid foods that have: ? Saturated fats. ? Trans fats. ? Added sugars.  Avoid foods that have more than 300 milligrams (mg) of sodium per serving. Limit your sodium intake to less than 2,300 mg each day. Shopping  Avoid buying pre-made and processed foods.  Avoid buying drinks with added sugar. Cooking  Cook with olive oil. Do not use butter, lard, or ghee.  Bake, broil, grill, steam, or boil foods. Avoid frying. Meal planning  Work with your dietitian to create an eating plan that is right for you. This may include tracking how many calories you take in each day. Use a food diary, notebook, or mobile application to track what you eat at each meal.  Consider following a Mediterranean diet. This includes: ? Eating several servings of fresh fruits and vegetables each day. ? Eating fish at least twice a week. ? Eating one serving each day of whole grains, beans, nuts, and seeds. ? Using olive oil instead of other fats. ? Limiting alcohol. ? Limiting red meat. ? Using nonfat or low-fat dairy products.  Consider following a plant-based diet. This includes dietary choices that focus on eating mostly vegetables and fruit, grains, beans, nuts, and seeds.  If you have high blood pressure, you may need to limit your sodium intake or follow a diet such as the DASH (Dietary Approaches to Stop Hypertension) eating plan. The DASH diet aims to lower high blood pressure.    Lifestyle  Set weight loss goals with help from your health care team. It is recommended that most people with prediabetes lose 7% of their body weight.  Exercise for at least 30 minutes 5 or more days a week.  Attend a support group or seek support from a mental health counselor.  Take over-the-counter and prescription  medicines only as told by your health care provider. What foods are recommended? Fruits Berries. Bananas. Apples. Oranges. Grapes. Papaya. Mango. Pomegranate. Kiwi. Grapefruit. Cherries. Vegetables Lettuce. Spinach. Peas. Beets. Cauliflower. Cabbage. Broccoli. Carrots. Tomatoes. Squash. Eggplant. Herbs. Peppers. Onions. Cucumbers. Brussels sprouts. Grains Whole grains, such as whole-wheat or whole-grain breads, crackers, cereals, and pasta. Unsweetened oatmeal. Bulgur. Barley. Quinoa. Brown rice. Corn or whole-wheat flour tortillas or taco shells. Meats and other proteins Seafood. Poultry without skin. Lean cuts of pork and beef. Tofu. Eggs. Nuts. Beans. Dairy Low-fat or fat-free dairy products, such as yogurt, cottage cheese, and cheese. Beverages Water. Tea. Coffee. Sugar-free or diet soda. Seltzer water. Low-fat or nonfat milk. Milk alternatives, such as soy or almond milk. Fats and oils Olive oil. Canola oil. Sunflower oil. Grapeseed oil. Avocado. Walnuts. Sweets and desserts Sugar-free or low-fat pudding. Sugar-free or low-fat ice cream and other frozen treats. Seasonings and condiments Herbs. Sodium-free spices. Mustard. Relish. Low-salt, low-sugar ketchup. Low-salt, low-sugar barbecue sauce. Low-fat or fat-free mayonnaise. The items listed above may not be a complete list of recommended foods and beverages. Contact a dietitian for more information. What foods are not recommended? Fruits Fruits canned with syrup. Vegetables Canned vegetables. Frozen vegetables with butter or cream sauce. Grains Refined white flour and flour products, such as bread, pasta,  snack foods, and cereals. Meats and other proteins Fatty cuts of meat. Poultry with skin. Breaded or fried meat. Processed meats. Dairy Full-fat yogurt, cheese, or milk. Beverages Sweetened drinks, such as iced tea and soda. Fats and oils Butter. Lard. Ghee. Sweets and desserts Baked goods, such as cake, cupcakes, pastries, cookies, and cheesecake. Seasonings and condiments Spice mixes with added salt. Ketchup. Barbecue sauce. Mayonnaise. The items listed above may not be a complete list of foods and beverages that are not recommended. Contact a dietitian for more information. Where to find more information  American Diabetes Association: www.diabetes.org Summary  You may need to make diet and lifestyle changes to help prevent the onset of diabetes. These changes can help you control blood sugar, improve cholesterol levels, and manage blood pressure.  Set weight loss goals with help from your health care team. It is recommended that most people with prediabetes lose 7% of their body weight.  Consider following a Mediterranean diet. This includes eating plenty of fresh fruits and vegetables, whole grains, beans, nuts, seeds, fish, and low-fat dairy, and using olive oil instead of other fats. This information is not intended to replace advice given to you by your health care provider. Make sure you discuss any questions you have with your health care provider. Document Revised: 03/03/2020 Document Reviewed: 03/03/2020 Elsevier Patient Education  [place].

## 2021-03-01 NOTE — Progress Notes (Signed)
Subjective:     [name] is a 55 y.o. female and is here for a comprehensive physical exam. The patient states she is trying to lose weight.  States she tried intermittent fasting, but it doesn't work for her.  Wants to start walking more.  Trying to drink more water.  Notes presyncopal episode earlier this week where she became dizzy.  Patient felt better after increasing intake of water.  Had salmon patties throughout the day, chicken wings and vegetables that evening for dinner.  Last mammogram September 2021.  Pap 8 years ago.  Patient status post complete hysterectomy.  Patient mentions follow-up in vaginal area that is not painful, present times a while.  Patient to schedule appointment to have this evaluated. Social History   Socioeconomic History  . Marital status: Married    Spouse name: Not on file  . Number of children: Not on file  . Years of education: Not on file  . Highest education level: Not on file  Occupational History  . Not on file  Tobacco Use  . Smoking status: Former Games developer  . Smokeless tobacco: Never Used  . Tobacco comment: quit 15 years ago (social smoker)  Vaping Use  . Vaping Use: Never used  Substance and Sexual Activity  . Alcohol use: Yes    Comment: ocass.   . Drug use: No  . Sexual activity: Not on file  Other Topics Concern  . Not on file  Social History Narrative  . Not on file   Social Determinants of Health   Financial Resource Strain: Not on file  Food Insecurity: Not on file  Transportation Needs: Not on file  Physical Activity: Not on file  Stress: Not on file  Social Connections: Not on file  Intimate Partner Violence: Not on file   Health Maintenance  Topic Date Due  . Hepatitis C Screening  Never done  . HIV Screening  Never done  . PAP SMEAR-Modifier  04/08/2014  . INFLUENZA VACCINE  04/17/2021 (Originally 07/17/2020)  . MAMMOGRAM  09/21/2021  . COLONOSCOPY (Pts 45-42yrs Insurance coverage will need to be  confirmed)  10/15/2029  . TETANUS/TDAP  12/30/2029  . COVID-19 Vaccine  Completed  . HPV VACCINES  Aged Out    The following portions of the patient's history were reviewed and updated as appropriate: allergies, current medications, past family history, past medical history, past social history, past surgical history and problem list.  Review of Systems Pertinent items noted in HPI and remainder of comprehensive ROS otherwise negative.   Objective:    BP 130/82 (BP Location: Right Arm, Patient Position: Sitting, Cuff Size: Normal)   Pulse 75   Temp 98 F (36.7 C) (Oral)   Ht 5' 5.5" (1.664 m)   Wt 197 lb (89.4 kg)   SpO2 97%   BMI 32.28 kg/m  General appearance: alert, cooperative and no distress Head: Normocephalic, without obvious abnormality, atraumatic Eyes: conjunctivae/corneas clear. PERRL, EOM's intact. Fundi benign. Ears: normal TM's and external ear canals both ears Nose: Nares normal. Septum midline. Mucosa normal. No drainage or sinus tenderness. Throat: lips, mucosa, and tongue normal; teeth and gums normal Neck: no adenopathy, no carotid bruit, no JVD, supple, symmetrical, trachea midline and thyroid not enlarged, symmetric, no tenderness/mass/nodules Lungs: clear to auscultation bilaterally Heart: regular rate and rhythm, S1, S2 normal, no murmur, click, rub or gallop Abdomen: soft, non-tender; bowel sounds normal; no masses,  no organomegaly Extremities: extremities normal, atraumatic, no cyanosis or edema Pulses: 2+  and symmetric Skin: Skin color, texture, turgor normal. No rashes or lesions Lymph nodes: Cervical, supraclavicular, and axillary nodes normal. Neurologic: Alert and oriented X 3, normal strength and tone. Normal symmetric reflexes. Normal coordination and gait    Assessment:    Healthy female exam.     Plan:     Anticipatory guidance given including wearing seatbelts, smoke detectors in the home, increasing physical activity, increasing p.o.  intake of water and vegetables. -We will obtain labs -Mammogram up-to-date, done September 2021 -Patient status post complete hysterectomy.  Followed by OB/GYN.  Discussed obtaining pelvic in the future to check vaginal cuff. -Colonoscopy done 10/16/2019.  Repeat due 2030 -Given handout -Next CPE in 1 year See After Visit Summary for Counseling Recommendations    Weight gain -Discussed lifestyle modifications - Plan: TSH, T4, Free, Hemoglobin A1c, CMP, Vitamin D, 25-hydroxy, Vitamin B12  Pre-syncope -Patient encouraged to increase p.o. intake of water and eat regular meals - Plan: CBC with Differential/Platelet, Hemoglobin A1c, CMP  Prediabetes -Hemoglobin A1c 6.2% on 02/24/2020 -Discussed decreasing carbs and sweets as well as increasing physical activity to lose weight. - Plan: CBC with Differential/Platelet, Hemoglobin A1c, Lipid panel  F/u prn.  Abbe Amsterdam, MD

## 2021-03-03 NOTE — Progress Notes (Signed)
Results viewed on MyChart. 

## 2022-03-29 ENCOUNTER — Encounter: Payer: Self-pay | Admitting: Family Medicine

## 2022-03-29 ENCOUNTER — Ambulatory Visit (INDEPENDENT_AMBULATORY_CARE_PROVIDER_SITE_OTHER): Payer: BC Managed Care – PPO | Admitting: Family Medicine

## 2022-03-29 ENCOUNTER — Telehealth: Payer: Self-pay | Admitting: Family Medicine

## 2022-03-29 VITALS — BP 134/76 | HR 68 | Temp 98.5°F | Ht 65.0 in | Wt 200.0 lb

## 2022-03-29 DIAGNOSIS — R7303 Prediabetes: Secondary | ICD-10-CM | POA: Diagnosis not present

## 2022-03-29 DIAGNOSIS — Z0000 Encounter for general adult medical examination without abnormal findings: Secondary | ICD-10-CM

## 2022-03-29 DIAGNOSIS — Z8639 Personal history of other endocrine, nutritional and metabolic disease: Secondary | ICD-10-CM | POA: Diagnosis not present

## 2022-03-29 LAB — COMPREHENSIVE METABOLIC PANEL
ALT: 16 U/L (ref 0–35)
AST: 23 U/L (ref 0–37)
Albumin: 4.3 g/dL (ref 3.5–5.2)
Alkaline Phosphatase: 57 U/L (ref 39–117)
BUN: 17 mg/dL (ref 6–23)
CO2: 27 mEq/L (ref 19–32)
Calcium: 9.5 mg/dL (ref 8.4–10.5)
Chloride: 103 mEq/L (ref 96–112)
Creatinine, Ser: 0.79 mg/dL (ref 0.40–1.20)
GFR: 83.92 mL/min (ref >60.00)
Glucose, Bld: 90 mg/dL (ref 70–99)
Potassium: 4.2 mEq/L (ref 3.5–5.1)
Sodium: 137 mEq/L (ref 135–145)
Total Bilirubin: 0.6 mg/dL (ref 0.2–1.2)
Total Protein: 7.6 g/dL (ref 6.0–8.3)

## 2022-03-29 LAB — LIPID PANEL
Cholesterol: 171 mg/dL (ref 0–200)
HDL: 70.9 mg/dL (ref >39.00)
LDL Cholesterol: 88 mg/dL (ref 0–99)
NonHDL: 100.47
Total CHOL/HDL Ratio: 2
Triglycerides: 63 mg/dL (ref 0.0–149.0)
VLDL: 12.6 mg/dL (ref 0.0–40.0)

## 2022-03-29 LAB — CBC WITH DIFFERENTIAL/PLATELET
Basophils Absolute: 0.1 10*3/uL (ref 0.0–0.1)
Basophils Relative: 1.2 % (ref 0.0–3.0)
Eosinophils Absolute: 0.2 10*3/uL (ref 0.0–0.7)
Eosinophils Relative: 4.3 % (ref 0.0–5.0)
HCT: 39.8 % (ref 36.0–46.0)
Hemoglobin: 12.8 g/dL (ref 12.0–15.0)
Lymphocytes Relative: 44.3 % (ref 12.0–46.0)
Lymphs Abs: 2.3 10*3/uL (ref 0.7–4.0)
MCHC: 32.1 g/dL (ref 30.0–36.0)
MCV: 90.4 fl (ref 78.0–100.0)
Monocytes Absolute: 0.2 10*3/uL (ref 0.1–1.0)
Monocytes Relative: 4.7 % (ref 3.0–12.0)
Neutro Abs: 2.4 10*3/uL (ref 1.4–7.7)
Neutrophils Relative %: 45.5 % (ref 43.0–77.0)
Platelets: 159 10*3/uL (ref 150.0–400.0)
RBC: 4.4 Mil/uL (ref 3.87–5.11)
RDW: 15.6 % — ABNORMAL HIGH (ref 11.5–15.5)
WBC: 5.3 10*3/uL (ref 4.0–10.5)

## 2022-03-29 LAB — HEMOGLOBIN A1C: Hgb A1c MFr Bld: 6.3 % (ref 4.6–6.5)

## 2022-03-29 LAB — VITAMIN D 25 HYDROXY (VIT D DEFICIENCY, FRACTURES): VITD: 32.93 ng/mL (ref 30.00–100.00)

## 2022-03-29 LAB — TSH: TSH: 1.62 u[IU]/mL (ref 0.35–5.50)

## 2022-03-29 LAB — T4, FREE: Free T4: 0.98 ng/dL (ref 0.60–1.60)

## 2022-03-29 NOTE — Telephone Encounter (Signed)
Late entry: Patient expressed to CNA that she was upset provider "refused" Pap/pelvic during CPE at 11 PM on 03/29/2022.  Patient contacted by this provider shortly after noon 03/29/2022 in regards to visit for CPE.  Patient informed it was an oversight and this provider did not refuse pelvic/Pap.  Patient did not mention pelvic/Pap to this provider during the entirety of the visit.  Patient advised she could have exam done today if desired.  Patient expressed frustration that no one seems to be willing to perform Pap/pelvic.  States went to OB/GYN and it was not done there.  Pt has h/o a modified radical hysterectomy, b/l salpingo-oopherectomy.  Pt states she may need a rx..  When asked about vaginal d/c or irritation she states she is unsure and that's why she needs a "swab or something" to be done.  Offered to complete exam today. Pt declined.  Offered appt later this wk, pt declined stating she had to work. ? ?Abbe Amsterdam, MD ?

## 2022-03-29 NOTE — Progress Notes (Signed)
Subjective:  ?  ? [name] is a 56 y.o. female and is here for a comprehensive physical exam. The patient reports no problems.  HAs regular f/u with Heme/for surveillance of malignant carcinoid tumor. ? ?Social History  ? ?Socioeconomic History  ? Marital status: Married  ?  Spouse name: Not on file  ? Number of children: Not on file  ? Years of education: Not on file  ? Highest education level: Not on file  ?Occupational History  ? Not on file  ?Tobacco Use  ? Smoking status: Former  ? Smokeless tobacco: Never  ? Tobacco comments:  ?  quit 15 years ago (social smoker)  ?Vaping Use  ? Vaping Use: Never used  ?Substance and Sexual Activity  ? Alcohol use: Yes  ?  Comment: ocass.   ? Drug use: No  ? Sexual activity: Not on file  ?Other Topics Concern  ? Not on file  ?Social History Narrative  ? Not on file  ? ?Social Determinants of Health  ? ?Financial Resource Strain: Not on file  ?Food Insecurity: Not on file  ?Transportation Needs: Not on file  ?Physical Activity: Not on file  ?Stress: Not on file  ?Social Connections: Not on file  ?Intimate Partner Violence: Not on file  ? ?Health Maintenance  ?Topic Date Due  ? HIV Screening  Never done  ? Hepatitis C Screening  Never done  ? PAP SMEAR-Modifier  04/08/2014  ? Zoster Vaccines- Shingrix (1 of 2) Never done  ? COVID-19 Vaccine (5 - Booster for Pfizer series) 08/11/2021  ? MAMMOGRAM  09/21/2021  ? INFLUENZA VACCINE  07/17/2022  ? COLONOSCOPY (Pts 45-34yrs Insurance coverage will need to be confirmed)  10/15/2029  ? TETANUS/TDAP  12/30/2029  ? HPV VACCINES  Aged Out  ? ? ?The following portions of the patient's history were reviewed and updated as appropriate: allergies, current medications, past family history, past medical history, past social history, past surgical history, and problem list. ? ?Review of Systems ?Pertinent items noted in HPI and remainder of comprehensive ROS otherwise negative.  ? ?Objective:  ? ? BP 134/76 (BP Location: Left Arm,  Patient Position: Sitting, Cuff Size: Normal)   Pulse 68   Temp 98.5 ?F (36.9 ?C) (Oral)   Ht 5\' 5"  (1.651 m)   Wt 200 lb (90.7 kg)   SpO2 100%   BMI 33.28 kg/m?  ?General appearance: alert, cooperative, and no distress ?Head: Normocephalic, without obvious abnormality, atraumatic ?Eyes: conjunctivae/corneas clear. PERRL, EOM's intact. Fundi benign. ?Ears: normal TM's and external ear canals both ears ?Nose: Nares normal. Septum midline. Mucosa normal. No drainage or sinus tenderness. ?Throat: lips, mucosa, and tongue normal; teeth and gums normal ?Neck: no adenopathy, no carotid bruit, no JVD, supple, symmetrical, trachea midline, and thyroid not enlarged, symmetric, no tenderness/mass/nodules ?Lungs: clear to auscultation bilaterally ?Heart: regular rate and rhythm, S1, S2 normal, no murmur, click, rub or gallop ?Abdomen: soft, non-tender; bowel sounds normal; no masses,  no organomegaly ?Extremities: extremities normal, atraumatic, no cyanosis or edema ?Pulses: 2+ and symmetric ?Skin: Skin color, texture, turgor normal. No rashes or lesions ?Lymph nodes: Cervical, supraclavicular, and axillary nodes normal. ?Neurologic: Alert and oriented X 3, normal strength and tone. Normal symmetric reflexes. Normal coordination and gait  ?  ?Assessment:  ? ? Healthy female exam.    ?  ?Plan:  ? ? Anticipatory guidance given including wearing seatbelts, smoke detectors in the home, increasing physical activity, increasing p.o. intake of water and vegetables. ?-  labs ?-colonoscopy done 10/16/19 ?-mammogram done 09/21/21 ?-s/p hysterectomy.  Per chart review seen by OB/Gyn. ?-CPE form for insurance completed and returned to pt ?-Next CPE in 1 yr ?See After Visit Summary for Counseling Recommendations  ? ?Prediabetes ?-Hemoglobin A1c 6.1% on 03/01/2021 ?-Lifestyle modifications ?- Plan: CMP, Hemoglobin A1c, Lipid panel ? ?History of vitamin D deficiency ?-Last vitamin D on 03/01/2021 normal. ?- Plan: Vitamin D,  25-hydroxy ? ?Follow-up as needed ? ?Abbe Amsterdam, MD ?

## 2022-04-06 ENCOUNTER — Encounter: Payer: Self-pay | Admitting: Family Medicine

## 2023-04-01 ENCOUNTER — Ambulatory Visit (INDEPENDENT_AMBULATORY_CARE_PROVIDER_SITE_OTHER): Payer: BC Managed Care – PPO | Admitting: Family Medicine

## 2023-04-01 ENCOUNTER — Encounter: Payer: Self-pay | Admitting: Family Medicine

## 2023-04-01 VITALS — BP 145/82 | HR 66 | Temp 99.0°F | Ht 64.8 in | Wt 196.2 lb

## 2023-04-01 DIAGNOSIS — Z0000 Encounter for general adult medical examination without abnormal findings: Secondary | ICD-10-CM | POA: Diagnosis not present

## 2023-04-01 DIAGNOSIS — Z1159 Encounter for screening for other viral diseases: Secondary | ICD-10-CM

## 2023-04-01 DIAGNOSIS — R7303 Prediabetes: Secondary | ICD-10-CM | POA: Diagnosis not present

## 2023-04-01 DIAGNOSIS — Z859 Personal history of malignant neoplasm, unspecified: Secondary | ICD-10-CM | POA: Diagnosis not present

## 2023-04-01 DIAGNOSIS — Z8639 Personal history of other endocrine, nutritional and metabolic disease: Secondary | ICD-10-CM

## 2023-04-01 LAB — LIPID PANEL
Cholesterol: 153 mg/dL (ref 0–200)
HDL: 67.6 mg/dL (ref >39.00)
LDL Cholesterol: 68 mg/dL (ref 0–99)
NonHDL: 85.67
Total CHOL/HDL Ratio: 2
Triglycerides: 89 mg/dL (ref 0.0–149.0)
VLDL: 17.8 mg/dL (ref 0.0–40.0)

## 2023-04-01 LAB — COMPREHENSIVE METABOLIC PANEL
ALT: 15 U/L (ref 0–35)
AST: 18 U/L (ref 0–37)
Albumin: 4.2 g/dL (ref 3.5–5.2)
Alkaline Phosphatase: 62 U/L (ref 39–117)
BUN: 17 mg/dL (ref 6–23)
CO2: 27 mEq/L (ref 19–32)
Calcium: 9.5 mg/dL (ref 8.4–10.5)
Chloride: 106 mEq/L (ref 96–112)
Creatinine, Ser: 0.91 mg/dL (ref 0.40–1.20)
GFR: 70.32 mL/min (ref >60.00)
Glucose, Bld: 94 mg/dL (ref 70–99)
Potassium: 4 mEq/L (ref 3.5–5.1)
Sodium: 140 mEq/L (ref 135–145)
Total Bilirubin: 0.4 mg/dL (ref 0.2–1.2)
Total Protein: 7.5 g/dL (ref 6.0–8.3)

## 2023-04-01 LAB — TSH: TSH: 1.96 u[IU]/mL (ref 0.35–5.50)

## 2023-04-01 LAB — VITAMIN D 25 HYDROXY (VIT D DEFICIENCY, FRACTURES): VITD: 34.69 ng/mL (ref 30.00–100.00)

## 2023-04-01 LAB — CBC WITH DIFFERENTIAL/PLATELET
Basophils Absolute: 0 10*3/uL (ref 0.0–0.1)
Basophils Relative: 0.8 % (ref 0.0–3.0)
Eosinophils Absolute: 0.1 10*3/uL (ref 0.0–0.7)
Eosinophils Relative: 2.2 % (ref 0.0–5.0)
HCT: 39.4 % (ref 36.0–46.0)
Hemoglobin: 12.8 g/dL (ref 12.0–15.0)
Lymphocytes Relative: 46.9 % — ABNORMAL HIGH (ref 12.0–46.0)
Lymphs Abs: 2.6 10*3/uL (ref 0.7–4.0)
MCHC: 32.6 g/dL (ref 30.0–36.0)
MCV: 90.1 fl (ref 78.0–100.0)
Monocytes Absolute: 0.3 10*3/uL (ref 0.1–1.0)
Monocytes Relative: 4.7 % (ref 3.0–12.0)
Neutro Abs: 2.5 10*3/uL (ref 1.4–7.7)
Neutrophils Relative %: 45.4 % (ref 43.0–77.0)
Platelets: 173 10*3/uL (ref 150.0–400.0)
RBC: 4.37 Mil/uL (ref 3.87–5.11)
RDW: 15 % (ref 11.5–15.5)
WBC: 5.5 10*3/uL (ref 4.0–10.5)

## 2023-04-01 LAB — T4, FREE: Free T4: 0.89 ng/dL (ref 0.60–1.60)

## 2023-04-01 LAB — HEMOGLOBIN A1C: Hgb A1c MFr Bld: 6.2 % (ref 4.6–6.5)

## 2023-04-01 NOTE — Progress Notes (Signed)
Established Patient Office Visit   Subjective  Patient ID: [name], female    DOB: 05-12-1966  Age: 57 y.o. MRN: [id number]  Chief Complaint  Patient presents with   Annual Exam    Not fasting    Patient is a 56 year old female with pmh sig for carcinoid tumor, history of vitamin D deficiency, pre-DM who was seen for CPE.  Doing well overall.  Has not scheduled mammogram in the last few years.    Patient Active Problem List   Diagnosis Date Noted   Prediabetes 04/01/2023   History of malignant carcinoid tumor 04/01/2023   Past Surgical History:  Procedure Laterality Date   ABDOMINAL HYSTERECTOMY     CESAREAN SECTION     CHOLECYSTECTOMY     Social History   Tobacco Use   Smoking status: Former   Smokeless tobacco: Never   Tobacco comments:    quit 15 years ago (social smoker)  Vaping Use   Vaping Use: Never used  Substance Use Topics   Alcohol use: Yes    Comment: ocass.    Drug use: No   History reviewed. No pertinent family history. Allergies  Allergen Reactions   Ioversol Hives    Other reaction(s): Laryngeal Edema (ALLERGY)      ROS Negative unless stated above    Objective:     BP (!) 145/82 (BP Location: Left Arm, Patient Position: Sitting, Cuff Size: Normal)   Pulse 66   Temp 99 F (37.2 C) (Oral)   Ht 5' 4.8" (1.646 m)   Wt 196 lb 3.2 oz (89 kg)   SpO2 96%   BMI 32.85 kg/m  BP Readings from Last 3 Encounters:  04/01/23 (!) 145/82  03/29/22 134/76  03/01/21 130/82   Wt Readings from Last 3 Encounters:  04/01/23 196 lb 3.2 oz (89 kg)  03/29/22 200 lb (90.7 kg)  03/01/21 197 lb (89.4 kg)      Physical Exam Constitutional:      Appearance: Normal appearance.  HENT:     Head: Normocephalic and atraumatic.     Right Ear: Tympanic membrane, ear canal and external ear normal.     Left Ear: Tympanic membrane, ear canal and external ear normal.     Nose: Nose normal.     Mouth/Throat:     Mouth: Mucous membranes are  moist.     Pharynx: No oropharyngeal exudate or posterior oropharyngeal erythema.  Eyes:     General: No scleral icterus.    Extraocular Movements: Extraocular movements intact.     Conjunctiva/sclera: Conjunctivae normal.     Pupils: Pupils are equal, round, and reactive to light.  Neck:     Thyroid: No thyromegaly.  Cardiovascular:     Rate and Rhythm: Normal rate and regular rhythm.     Pulses: Normal pulses.     Heart sounds: Normal heart sounds. No murmur heard.    No friction rub.  Pulmonary:     Effort: Pulmonary effort is normal.     Breath sounds: Normal breath sounds. No wheezing, rhonchi or rales.  Abdominal:     General: Bowel sounds are normal.     Palpations: Abdomen is soft.     Tenderness: There is no abdominal tenderness.  Musculoskeletal:        General: No deformity. Normal range of motion.  Lymphadenopathy:     Cervical: No cervical adenopathy.  Skin:    General: Skin is warm and dry.     Findings: No lesion.  Neurological:     General: No focal deficit present.     Mental Status: She is alert and oriented to person, place, and time.  Psychiatric:        Mood and Affect: Mood normal.        Thought Content: Thought content normal.      No results found for any visits on 04/01/23.    Assessment & Plan:  Well adult exam -Anticipatory guidance given including wearing seatbelts, smoke detectors in the home, increasing physical activity, increasing p.o. intake of water and vegetables. -labs.  Pt is not fasting. -Pt apply to schedule mammogram.  Last done 09/21/2021 -Pap not indicated 2/2 s/p hysterectomy. -Colonoscopy up-to-date done 10/16/2019 -Immunizations reviewed.  Patient declines at this time -Next CPE in 1 year -Physical form for insurance company completed -     TSH -     T4, free  Prediabetes -     Hemoglobin A1c -     Lipid panel  History of malignant carcinoid tumor -     CBC with Differential/Platelet -     Comprehensive metabolic  panel  History of vitamin D deficiency -     VITAMIN D 25 Hydroxy (Vit-D Deficiency, Fractures)   Return if symptoms worsen or fail to improve.   Deeann Saint, MD

## 2023-04-01 NOTE — Addendum Note (Signed)
Addended by: Marian Sorrow D on: 04/01/2023 02:39 PM   Modules accepted: Orders

## 2023-04-02 LAB — HEPATITIS C ANTIBODY: Hepatitis C Ab: NONREACTIVE

## 2024-03-03 ENCOUNTER — Ambulatory Visit: Payer: Self-pay | Admitting: Family Medicine

## 2024-03-03 NOTE — Telephone Encounter (Signed)
  Chief Complaint: HR alarm on watch Symptoms: denies Frequency: today within last hour Pertinent Negatives: Patient denies CP, SOB, difficulty breathing, palpitations, dizziness, lightheadedness Disposition: [] ED /[] Urgent Care (no appt availability in office) / [x] Appointment(In office/virtual)/ []  [facility]/ [] Home Care/ [] Refused Recommended Disposition /[] [facility]/ []  Follow-up with PCP Additional Notes: Pt states she was sitting when her watch alarmed for a high HR. Pt states that at her last PCP appt she had a high BP.  Pt states that she does not take medication for BP. Pt states that her HR was 120s per her watch. Pt states that she was not feeling any palpitations, CP, nor SOB. Pt states that she was feeling fine, asymptomatic. Pt states that her watch has never alarmed for this before.  Pt advised to call back should this happen again. Pt agreeable. Pt sche for tomorrow.   Copied from CRM [id number]. Topic: Clinical - Red Word Triage >> Mar 03, 2024 10:31 AM Truddie Crumble wrote: Red Word that prompted transfer to Nurse Triage: patient heart rate is up Reason for Disposition  Heart rhythm alert (e.g., "you have irregular heartbeat") from personal wearable device (e.g., Apple Watch)  Answer Assessment - Initial Assessment Questions 1. DESCRIPTION: "Please describe your heart rate or heartbeat that you are having" (e.g., fast/slow, regular/irregular, skipped or extra beats, "palpitations")     Fast per  watch 2. ONSET: "When did it start?" (Minutes, hours or days)      Within the hour 3. DURATION: "How long does it last" (e.g., seconds, minutes, hours)     Per pt the HR  per her watch was elevated for 10 minutes 4. PATTERN "Does it come and go, or has it been constant since it started?"  "Does it get worse with exertion?"   "Are you feeling it now?"     Feeling fine before and now, no s/s 5. TAP: "Using your hand, can you tap out what you are feeling on a chair or  table in front of you, so that I can hear?" (Note: not all patients can do this)       Pt was asymptomatic 6. HEART RATE: "Can you tell me your heart rate?" "How many beats in 15 seconds?"  (Note: not all patients can do this)       Per her watch HR was 120s 7. RECURRENT SYMPTOM: "Have you ever had this before?" If Yes, ask: "When was the last time?" and "What happened that time?"      denies 8. CAUSE: "What do you think is causing the palpitations?"     Denies palpitations 9. CARDIAC HISTORY: "Do you have any history of heart disease?" (e.g., heart attack, angina, bypass surgery, angioplasty, arrhythmia)      denies 10. OTHER SYMPTOMS: "Do you have any other symptoms?" (e.g., dizziness, chest pain, sweating, difficulty breathing)       denies  Protocols used: Heart Rate and Heartbeat Questions-A-AH

## 2024-03-03 NOTE — Telephone Encounter (Signed)
 Patient has an appt 3/19

## 2024-03-04 ENCOUNTER — Encounter: Payer: Self-pay | Admitting: Internal Medicine

## 2024-03-04 ENCOUNTER — Ambulatory Visit: Admitting: Internal Medicine

## 2024-03-04 VITALS — BP 130/80 | HR 88 | Temp 98.2°F | Wt 200.6 lb

## 2024-03-04 DIAGNOSIS — R000 Tachycardia, unspecified: Secondary | ICD-10-CM | POA: Diagnosis not present

## 2024-03-04 DIAGNOSIS — L03012 Cellulitis of left finger: Secondary | ICD-10-CM

## 2024-03-04 MED ORDER — DOXYCYCLINE HYCLATE 100 MG PO TABS
100.0000 mg | ORAL_TABLET | Freq: Two times a day (BID) | ORAL | 0 refills | Status: AC
Start: 2024-03-04 — End: 2024-03-11

## 2024-03-04 NOTE — Progress Notes (Signed)
 Established Patient Office Visit     CC/Reason for Visit: Alarm on Apple Watch, infection of left fourth finger  HPI: [name] is a 58 y.o. female who is coming in today for the above mentioned reasons.  Here to discuss 2 main issues:  1.  Yesterday she got an alarm for her Apple Watch stating that her heart rate was above 120.  She did an EKG and it was tagged as A-fib so she scheduled this visit.  She has been feeling well.  She has not had palpitations, chest pain, shortness of breath, dizziness.  2.  She has been having some pain and swelling at the cuticle base of her left fourth finger.  At times she has been able to express some purulent material from it.  She is a Scientist, research (medical).   Past Medical/Surgical History: History reviewed. No pertinent past medical history.  Past Surgical History:  Procedure Laterality Date   ABDOMINAL HYSTERECTOMY     CESAREAN SECTION     CHOLECYSTECTOMY      Social History:  reports that she has quit smoking. She has never used smokeless tobacco. She reports current alcohol use. She reports that she does not use drugs.  Allergies: Allergies  Allergen Reactions   Ioversol Hives    Other reaction(s): Laryngeal Edema (ALLERGY)    Family History:  History reviewed. No pertinent family history.   Current Outpatient Medications:    doxycycline (VIBRA-TABS) 100 MG tablet, Take 1 tablet (100 mg total) by mouth 2 (two) times daily for 7 days., Disp: 14 tablet, Rfl: 0   estradiol (ESTRACE) 0.5 MG tablet, Take by mouth., Disp: , Rfl:   Review of Systems:  Negative unless indicated in HPI.   Physical Exam: Vitals:   03/04/24 0826  BP: 130/80  Pulse: 88  Temp: 98.2 F (36.8 C)  TempSrc: Oral  SpO2: 99%  Weight: 200 lb 9.6 oz (91 kg)    Body mass index is 33.59 kg/m.   Physical Exam Vitals reviewed.  Constitutional:      Appearance: Normal appearance.  HENT:     Head: Normocephalic and atraumatic.  Eyes:      Conjunctiva/sclera: Conjunctivae normal.  Cardiovascular:     Rate and Rhythm: Normal rate and regular rhythm.  Pulmonary:     Effort: Pulmonary effort is normal.     Breath sounds: Normal breath sounds.  Musculoskeletal:     Comments: Paronychia left fourth finger apparent  Skin:    General: Skin is warm and dry.  Neurological:     General: No focal deficit present.     Mental Status: She is alert and oriented to person, place, and time.  Psychiatric:        Mood and Affect: Mood normal.        Behavior: Behavior normal.        Thought Content: Thought content normal.        Judgment: Judgment normal.      Impression and Plan:  Tachycardia  Paronychia of finger of left hand -     Doxycycline Hyclate; Take 1 tablet (100 mg total) by mouth 2 (two) times daily for 7 days.  Dispense: 14 tablet; Refill: 0   -Review of EKG tracing does not show A-fib as recorded, instead there is a single PVC with regular sinus rhythm following.  P waves are clearly visible.  No further concerns. -Will send doxycycline for 7 days for the paronychia of her left fourth ring  finger.  Time spent:31 minutes reviewing chart, interviewing and examining patient and formulating plan of care.     Chaya Jan, MD [facility]

## 2024-04-06 ENCOUNTER — Encounter: Payer: Self-pay | Admitting: Family Medicine

## 2024-04-06 ENCOUNTER — Ambulatory Visit (INDEPENDENT_AMBULATORY_CARE_PROVIDER_SITE_OTHER): Admitting: Family Medicine

## 2024-04-06 ENCOUNTER — Encounter: Admitting: Family Medicine

## 2024-04-06 VITALS — BP 120/80 | HR 97 | Temp 98.4°F | Ht 65.0 in | Wt 202.4 lb

## 2024-04-06 DIAGNOSIS — Z0000 Encounter for general adult medical examination without abnormal findings: Secondary | ICD-10-CM | POA: Diagnosis not present

## 2024-04-06 DIAGNOSIS — Z13220 Encounter for screening for lipoid disorders: Secondary | ICD-10-CM | POA: Diagnosis not present

## 2024-04-06 DIAGNOSIS — Z1231 Encounter for screening mammogram for malignant neoplasm of breast: Secondary | ICD-10-CM | POA: Diagnosis not present

## 2024-04-06 DIAGNOSIS — R7303 Prediabetes: Secondary | ICD-10-CM

## 2024-04-06 LAB — COMPREHENSIVE METABOLIC PANEL WITH GFR
ALT: 14 U/L (ref 0–35)
AST: 18 U/L (ref 0–37)
Albumin: 4.1 g/dL (ref 3.5–5.2)
Alkaline Phosphatase: 64 U/L (ref 39–117)
BUN: 16 mg/dL (ref 6–23)
CO2: 28 meq/L (ref 19–32)
Calcium: 9.2 mg/dL (ref 8.4–10.5)
Chloride: 105 meq/L (ref 96–112)
Creatinine, Ser: 0.77 mg/dL (ref 0.40–1.20)
GFR: 85.32 mL/min (ref >60.00)
Glucose, Bld: 99 mg/dL (ref 70–99)
Potassium: 4 meq/L (ref 3.5–5.1)
Sodium: 140 meq/L (ref 135–145)
Total Bilirubin: 0.4 mg/dL (ref 0.2–1.2)
Total Protein: 7 g/dL (ref 6.0–8.3)

## 2024-04-06 LAB — CBC WITH DIFFERENTIAL/PLATELET
Basophils Absolute: 0.1 10*3/uL (ref 0.0–0.1)
Basophils Relative: 1.2 % (ref 0.0–3.0)
Eosinophils Absolute: 0.2 10*3/uL (ref 0.0–0.7)
Eosinophils Relative: 3.9 % (ref 0.0–5.0)
HCT: 38.6 % (ref 36.0–46.0)
Hemoglobin: 12.4 g/dL (ref 12.0–15.0)
Lymphocytes Relative: 43 % (ref 12.0–46.0)
Lymphs Abs: 2.2 10*3/uL (ref 0.7–4.0)
MCHC: 32.1 g/dL (ref 30.0–36.0)
MCV: 90.6 fl (ref 78.0–100.0)
Monocytes Absolute: 0.2 10*3/uL (ref 0.1–1.0)
Monocytes Relative: 4.7 % (ref 3.0–12.0)
Neutro Abs: 2.4 10*3/uL (ref 1.4–7.7)
Neutrophils Relative %: 47.2 % (ref 43.0–77.0)
Platelets: 151 10*3/uL (ref 150.0–400.0)
RBC: 4.27 Mil/uL (ref 3.87–5.11)
RDW: 15.3 % (ref 11.5–15.5)
WBC: 5.1 10*3/uL (ref 4.0–10.5)

## 2024-04-06 LAB — TSH: TSH: 1.65 u[IU]/mL (ref 0.35–5.50)

## 2024-04-06 LAB — LIPID PANEL
Cholesterol: 160 mg/dL (ref 0–200)
HDL: 67.1 mg/dL (ref >39.00)
LDL Cholesterol: 83 mg/dL (ref 0–99)
NonHDL: 92.71
Total CHOL/HDL Ratio: 2
Triglycerides: 50 mg/dL (ref 0.0–149.0)
VLDL: 10 mg/dL (ref 0.0–40.0)

## 2024-04-06 LAB — HEMOGLOBIN A1C: Hgb A1c MFr Bld: 6.3 % (ref 4.6–6.5)

## 2024-04-06 LAB — T4, FREE: Free T4: 0.81 ng/dL (ref 0.60–1.60)

## 2024-04-06 NOTE — Progress Notes (Signed)
 Established Patient Office Visit   Subjective  Patient ID: [name], female    DOB: 1966-10-19  Age: 58 y.o. MRN: [id number]  Chief Complaint  Patient presents with   Annual Exam    Patient is a 58 year old female seen for CPE and follow-up.  Patient states she is doing well overall.  Not currently taking any medications.  Previously seen 03/04/2024 for tachycardia.  Has not had any episodes since that appointment.  Patient's watch is not working but that is what alerted her to the episodes in the past.  Patient denies caffeine intake, chocolate.  Unsure of sleep or stress was a factor last month.  Patient plans on restarting this.  [facility] [employer]  Was walking in the neighborhood but stopped due to dogs.  Plans to work on increasing intake of water.  Patient notes occasional night sweats/feeling hot due to menopause.  Last mammogram 09/22/2019.  Patient has a physical completion form for her job.    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 Past Surgical History:  Procedure Laterality Date   ABDOMINAL HYSTERECTOMY     CESAREAN SECTION     CHOLECYSTECTOMY     Social History   Tobacco Use   Smoking status: Former   Smokeless tobacco: Never   Tobacco comments:    quit 15 years ago (social smoker)  Vaping Use   Vaping status: Never Used  Substance Use Topics   Alcohol use: Yes    Comment: ocass.    Drug use: No   No family history on file. Allergies  Allergen Reactions   Ioversol Hives    Other reaction(s): Laryngeal Edema (ALLERGY)      ROS Negative unless stated above    Objective:     BP 120/80 (BP Location: Left Arm, Patient Position: Sitting, Cuff Size: Large)   Pulse 97   Temp 98.4 F (36.9 C) (Oral)   Ht 5\' 5"  (1.651 m)   Wt 202 lb 6.4 oz (91.8 kg)   SpO2 99%   BMI 33.68 kg/m  BP Readings from Last 3  Encounters:  04/06/24 120/80  03/04/24 130/80  04/01/23 (!) 145/82   Wt Readings from Last 3 Encounters:  04/06/24 202 lb 6.4 oz (91.8 kg)  03/04/24 200 lb 9.6 oz (91 kg)  04/01/23 196 lb 3.2 oz (89 kg)      Physical Exam Constitutional:      Appearance: Normal appearance.  HENT:     Head: Normocephalic and atraumatic.     Right Ear: Tympanic membrane, ear canal and external ear normal.     Left Ear: Tympanic membrane, ear canal and external ear normal.     Nose: Nose normal.     Mouth/Throat:     Mouth: Mucous membranes are moist.     Pharynx: No oropharyngeal exudate or posterior oropharyngeal erythema.  Eyes:     General: No scleral icterus.    Extraocular Movements: Extraocular movements intact.     Conjunctiva/sclera: Conjunctivae normal.     Pupils: Pupils are equal, round, and reactive to light.  Neck:     Thyroid : No thyromegaly.  Cardiovascular:     Rate and Rhythm: Normal rate and regular rhythm.     Pulses: Normal pulses.     Heart sounds: Normal heart sounds. No murmur heard.    No friction rub.  Pulmonary:     Effort: Pulmonary effort is normal.     Breath sounds: Normal breath sounds. No wheezing, rhonchi or rales.  Abdominal:     General: Bowel sounds are normal.     Palpations: Abdomen is soft.     Tenderness: There is no abdominal tenderness.  Musculoskeletal:        General: No deformity. Normal range of motion.  Lymphadenopathy:     Cervical: No cervical adenopathy.  Skin:    General: Skin is warm and dry.     Findings: No lesion.  Neurological:     General: No focal deficit present.     Mental Status: She is alert and oriented to person, place, and time.  Psychiatric:        Mood and Affect: Mood normal.        Thought Content: Thought content normal.       04/06/2024    8:59 AM 03/04/2024    8:25 AM 04/01/2023    2:20 PM  Depression screen PHQ 2/9  Decreased Interest 0 0 0  Down, Depressed, Hopeless 0 0 0  PHQ - 2 Score 0 0 0   Altered sleeping   0  Tired, decreased energy   0  Change in appetite   0  Feeling bad or failure about yourself    0  Trouble concentrating   0  Moving slowly or fidgety/restless   0  Suicidal thoughts   0  PHQ-9 Score   0      04/01/2023    2:21 PM  GAD 7 : Generalized Anxiety Score  Nervous, Anxious, on Edge 0  Control/stop worrying 0  Worry too much - different things 0  Trouble relaxing 0  Restless 0  Easily annoyed or irritable 0  Afraid - awful might happen 0  Total GAD 7 Score 0    No results found for any visits on 04/06/24.    Assessment & Plan:  Well adult exam -     CBC with Differential/Platelet; Future -     Comprehensive metabolic panel with GFR; Future -     Lipid panel; Future -     TSH; Future -     Hemoglobin A1c; Future -     T4, free; Future  Encounter for screening mammogram for malignant neoplasm of breast -     Digital Screening Mammogram, Left and Right; Future  Prediabetes -     Hemoglobin A1c; Future  Age-appropriate health screenings discussed.  Obtain labs.  Physical form for employer completed.  Immunizations reviewed.  Mammogram ordered,  Colonoscopy last done 10/16/19.  Pap not indicated 2/2 h/o hysterectomy.  Lifestyle modifications encouraged as Hgb A1C was 6.2% on 04/01/23.  Next CPE in 1 year.  Return if symptoms worsen or fail to improve.   Viola Greulich, MD

## 2024-04-07 ENCOUNTER — Encounter: Payer: Self-pay | Admitting: Family Medicine
